# Patient Record
Sex: Male | Born: 1955 | Race: Asian | Hispanic: No | Marital: Married | Smoking: Former smoker
Health system: Southern US, Community
[De-identification: ages and names within clinical notes are randomized; demographics above are authoritative.]

## PROBLEM LIST (undated history)

## (undated) DIAGNOSIS — M1990 Unspecified osteoarthritis, unspecified site: Secondary | ICD-10-CM

## (undated) DIAGNOSIS — E0590 Thyrotoxicosis, unspecified without thyrotoxic crisis or storm: Secondary | ICD-10-CM

## (undated) DIAGNOSIS — I5030 Unspecified diastolic (congestive) heart failure: Secondary | ICD-10-CM

## (undated) DIAGNOSIS — E119 Type 2 diabetes mellitus without complications: Secondary | ICD-10-CM

## (undated) DIAGNOSIS — I4891 Unspecified atrial fibrillation: Secondary | ICD-10-CM

## (undated) DIAGNOSIS — I509 Heart failure, unspecified: Secondary | ICD-10-CM

## (undated) DIAGNOSIS — K219 Gastro-esophageal reflux disease without esophagitis: Secondary | ICD-10-CM

## (undated) HISTORY — DX: Unspecified diastolic (congestive) heart failure: I50.30

## (undated) HISTORY — DX: Heart failure, unspecified: I50.9

---

## 2021-12-14 ENCOUNTER — Emergency Department (HOSPITAL_BASED_OUTPATIENT_CLINIC_OR_DEPARTMENT_OTHER): Payer: Commercial Managed Care - PPO

## 2021-12-14 ENCOUNTER — Encounter (HOSPITAL_BASED_OUTPATIENT_CLINIC_OR_DEPARTMENT_OTHER): Payer: Self-pay | Admitting: Emergency Medicine

## 2021-12-14 ENCOUNTER — Other Ambulatory Visit: Payer: Self-pay

## 2021-12-14 ENCOUNTER — Observation Stay (HOSPITAL_COMMUNITY): Payer: Commercial Managed Care - PPO

## 2021-12-14 ENCOUNTER — Inpatient Hospital Stay (HOSPITAL_BASED_OUTPATIENT_CLINIC_OR_DEPARTMENT_OTHER)
Admission: EM | Admit: 2021-12-14 | Discharge: 2021-12-18 | DRG: 308 | Disposition: A | Discharge status: Home / Self Care | Payer: Commercial Managed Care - PPO | Attending: Internal Medicine | Admitting: Internal Medicine

## 2021-12-14 DIAGNOSIS — I499 Cardiac arrhythmia, unspecified: Secondary | ICD-10-CM | POA: Diagnosis present

## 2021-12-14 DIAGNOSIS — I272 Pulmonary hypertension, unspecified: Secondary | ICD-10-CM | POA: Diagnosis present

## 2021-12-14 DIAGNOSIS — E049 Nontoxic goiter, unspecified: Secondary | ICD-10-CM | POA: Diagnosis not present

## 2021-12-14 DIAGNOSIS — I509 Heart failure, unspecified: Secondary | ICD-10-CM

## 2021-12-14 DIAGNOSIS — D696 Thrombocytopenia, unspecified: Secondary | ICD-10-CM | POA: Diagnosis not present

## 2021-12-14 DIAGNOSIS — I5033 Acute on chronic diastolic (congestive) heart failure: Secondary | ICD-10-CM | POA: Diagnosis not present

## 2021-12-14 DIAGNOSIS — I428 Other cardiomyopathies: Secondary | ICD-10-CM | POA: Diagnosis not present

## 2021-12-14 DIAGNOSIS — Z87891 Personal history of nicotine dependence: Secondary | ICD-10-CM

## 2021-12-14 DIAGNOSIS — G8929 Other chronic pain: Secondary | ICD-10-CM | POA: Diagnosis present

## 2021-12-14 DIAGNOSIS — R000 Tachycardia, unspecified: Secondary | ICD-10-CM | POA: Diagnosis not present

## 2021-12-14 DIAGNOSIS — M549 Dorsalgia, unspecified: Secondary | ICD-10-CM | POA: Diagnosis not present

## 2021-12-14 DIAGNOSIS — I671 Cerebral aneurysm, nonruptured: Secondary | ICD-10-CM | POA: Diagnosis not present

## 2021-12-14 DIAGNOSIS — K219 Gastro-esophageal reflux disease without esophagitis: Secondary | ICD-10-CM | POA: Diagnosis not present

## 2021-12-14 DIAGNOSIS — E119 Type 2 diabetes mellitus without complications: Secondary | ICD-10-CM | POA: Diagnosis not present

## 2021-12-14 DIAGNOSIS — M899 Disorder of bone, unspecified: Secondary | ICD-10-CM | POA: Diagnosis present

## 2021-12-14 DIAGNOSIS — R011 Cardiac murmur, unspecified: Secondary | ICD-10-CM | POA: Diagnosis present

## 2021-12-14 DIAGNOSIS — I480 Paroxysmal atrial fibrillation: Principal | ICD-10-CM | POA: Diagnosis present

## 2021-12-14 DIAGNOSIS — I5030 Unspecified diastolic (congestive) heart failure: Secondary | ICD-10-CM

## 2021-12-14 DIAGNOSIS — G459 Transient cerebral ischemic attack, unspecified: Secondary | ICD-10-CM | POA: Diagnosis not present

## 2021-12-14 DIAGNOSIS — M1990 Unspecified osteoarthritis, unspecified site: Secondary | ICD-10-CM | POA: Diagnosis not present

## 2021-12-14 DIAGNOSIS — E1165 Type 2 diabetes mellitus with hyperglycemia: Secondary | ICD-10-CM

## 2021-12-14 DIAGNOSIS — I5031 Acute diastolic (congestive) heart failure: Secondary | ICD-10-CM

## 2021-12-14 DIAGNOSIS — I4891 Unspecified atrial fibrillation: Secondary | ICD-10-CM | POA: Diagnosis not present

## 2021-12-14 DIAGNOSIS — R471 Dysarthria and anarthria: Secondary | ICD-10-CM | POA: Diagnosis not present

## 2021-12-14 DIAGNOSIS — E0590 Thyrotoxicosis, unspecified without thyrotoxic crisis or storm: Secondary | ICD-10-CM | POA: Diagnosis present

## 2021-12-14 DIAGNOSIS — R42 Dizziness and giddiness: Secondary | ICD-10-CM | POA: Diagnosis present

## 2021-12-14 DIAGNOSIS — D649 Anemia, unspecified: Secondary | ICD-10-CM | POA: Diagnosis present

## 2021-12-14 DIAGNOSIS — R9431 Abnormal electrocardiogram [ECG] [EKG]: Secondary | ICD-10-CM

## 2021-12-14 HISTORY — DX: Thyrotoxicosis, unspecified without thyrotoxic crisis or storm: E05.90

## 2021-12-14 HISTORY — DX: Gastro-esophageal reflux disease without esophagitis: K21.9

## 2021-12-14 HISTORY — DX: Unspecified osteoarthritis, unspecified site: M19.90

## 2021-12-14 HISTORY — DX: Unspecified atrial fibrillation: I48.91

## 2021-12-14 HISTORY — DX: Type 2 diabetes mellitus without complications: E11.9

## 2021-12-14 LAB — HEPATIC FUNCTION PANEL
ALT: 23 U/L (ref 0–44)
AST: 32 U/L (ref 15–41)
Albumin: 3.5 g/dL (ref 3.5–5.0)
Alkaline Phosphatase: 223 U/L — ABNORMAL HIGH (ref 38–126)
Bilirubin, Direct: 0.7 mg/dL — ABNORMAL HIGH (ref 0.0–0.2)
Indirect Bilirubin: 0.9 mg/dL (ref 0.3–0.9)
Total Bilirubin: 1.6 mg/dL — ABNORMAL HIGH (ref 0.3–1.2)
Total Protein: 7 g/dL (ref 6.5–8.1)

## 2021-12-14 LAB — BASIC METABOLIC PANEL
Anion gap: 6 (ref 5–15)
BUN: 12 mg/dL (ref 8–23)
CO2: 23 mmol/L (ref 22–32)
Calcium: 8.6 mg/dL — ABNORMAL LOW (ref 8.9–10.3)
Chloride: 109 mmol/L (ref 98–111)
Creatinine, Ser: 0.43 mg/dL — ABNORMAL LOW (ref 0.61–1.24)
GFR, Estimated: >60 mL/min (ref >60)
Glucose, Bld: 162 mg/dL — ABNORMAL HIGH (ref 70–99)
Potassium: 3.9 mmol/L (ref 3.5–5.1)
Sodium: 138 mmol/L (ref 135–145)

## 2021-12-14 LAB — URINALYSIS, ROUTINE W REFLEX MICROSCOPIC
Bilirubin Urine: NEGATIVE
Glucose, UA: 250 mg/dL — AB
Hgb urine dipstick: NEGATIVE
Ketones, ur: NEGATIVE mg/dL
Leukocytes,Ua: NEGATIVE
Nitrite: NEGATIVE
Protein, ur: NEGATIVE mg/dL
Specific Gravity, Urine: 1.025 (ref 1.005–1.030)
pH: 7 (ref 5.0–8.0)

## 2021-12-14 LAB — CBC
HCT: 37.6 % — ABNORMAL LOW (ref 39.0–52.0)
Hemoglobin: 12.7 g/dL — ABNORMAL LOW (ref 13.0–17.0)
MCH: 29.6 pg (ref 26.0–34.0)
MCHC: 33.8 g/dL (ref 30.0–36.0)
MCV: 87.6 fL (ref 80.0–100.0)
Platelets: 112 10*3/uL — ABNORMAL LOW (ref 150–400)
RBC: 4.29 MIL/uL (ref 4.22–5.81)
RDW: 14.8 % (ref 11.5–15.5)
WBC: 5.5 10*3/uL (ref 4.0–10.5)
nRBC: 0 % (ref 0.0–0.2)

## 2021-12-14 LAB — TROPONIN I (HIGH SENSITIVITY)
Troponin I (High Sensitivity): 9 ng/L (ref <18)
Troponin I (High Sensitivity): 9 ng/L (ref <18)

## 2021-12-14 LAB — BRAIN NATRIURETIC PEPTIDE: B Natriuretic Peptide: 476.6 pg/mL — ABNORMAL HIGH (ref 0.0–100.0)

## 2021-12-14 LAB — MAGNESIUM: Magnesium: 1.8 mg/dL (ref 1.7–2.4)

## 2021-12-14 LAB — GLUCOSE, CAPILLARY: Glucose-Capillary: 166 mg/dL — ABNORMAL HIGH (ref 70–99)

## 2021-12-14 LAB — TSH: TSH: <0.01 u[IU]/mL — ABNORMAL LOW (ref 0.350–4.500)

## 2021-12-14 MED ORDER — ONDANSETRON HCL 4 MG/2ML IJ SOLN: 4.0000 mg | Freq: Four times a day (QID) | INTRAMUSCULAR | Status: DC | PRN

## 2021-12-14 MED ORDER — ATENOLOL 25 MG PO TABS
12.5000 mg | ORAL_TABLET | Freq: Two times a day (BID) | ORAL | Status: DC
  Administered 2021-12-14 – 2021-12-15 (×2): 12.5 mg via ORAL
  Filled 2021-12-14 (×2): qty 1

## 2021-12-14 MED ORDER — METOPROLOL TARTRATE 25 MG PO TABS: 25.0000 mg | ORAL_TABLET | Freq: Two times a day (BID) | ORAL | Status: DC

## 2021-12-14 MED ORDER — APIXABAN 5 MG PO TABS
5.0000 mg | ORAL_TABLET | Freq: Two times a day (BID) | ORAL | Status: DC
  Administered 2021-12-14 – 2021-12-18 (×8): 5 mg via ORAL
  Filled 2021-12-14 (×8): qty 1

## 2021-12-14 MED ORDER — DILTIAZEM HCL-DEXTROSE 125-5 MG/125ML-% IV SOLN (PREMIX)
5.0000 mg/h | INTRAVENOUS | Status: DC
  Administered 2021-12-14: 5 mg/h via INTRAVENOUS
  Filled 2021-12-14: qty 125

## 2021-12-14 MED ORDER — ACETAMINOPHEN 325 MG PO TABS: 650.0000 mg | ORAL_TABLET | ORAL | Status: DC | PRN

## 2021-12-14 MED ORDER — FUROSEMIDE 10 MG/ML IJ SOLN
20.0000 mg | Freq: Two times a day (BID) | INTRAMUSCULAR | Status: DC
  Administered 2021-12-14: 20 mg via INTRAVENOUS
  Filled 2021-12-14: qty 2

## 2021-12-14 MED ORDER — DILTIAZEM LOAD VIA INFUSION
20.0000 mg | Freq: Once | INTRAVENOUS | Status: AC
  Administered 2021-12-14: 20 mg via INTRAVENOUS
  Filled 2021-12-14: qty 20

## 2021-12-14 MED ORDER — IOHEXOL 350 MG/ML SOLN
75.0000 mL | Freq: Once | INTRAVENOUS | Status: AC | PRN
  Administered 2021-12-14: 75 mL via INTRAVENOUS

## 2021-12-14 MED ORDER — HEPARIN BOLUS VIA INFUSION
2500.0000 [IU] | Freq: Once | INTRAVENOUS | Status: AC
  Administered 2021-12-14: 2500 [IU] via INTRAVENOUS

## 2021-12-14 MED ORDER — INSULIN ASPART 100 UNIT/ML IJ SOLN
0.0000 [IU] | Freq: Three times a day (TID) | INTRAMUSCULAR | Status: DC
  Administered 2021-12-15 – 2021-12-16 (×2): 1 [IU] via SUBCUTANEOUS
  Administered 2021-12-17: 3 [IU] via SUBCUTANEOUS
  Administered 2021-12-18: 2 [IU] via SUBCUTANEOUS

## 2021-12-14 MED ORDER — HEPARIN (PORCINE) 25000 UT/250ML-% IV SOLN
800.0000 [IU]/h | INTRAVENOUS | Status: DC
Start: 2021-12-14 — End: 2021-12-14
  Administered 2021-12-14: 800 [IU]/h via INTRAVENOUS
  Filled 2021-12-14: qty 250

## 2021-12-14 MED ORDER — METOPROLOL TARTRATE 5 MG/5ML IV SOLN: 2.5000 mg | INTRAVENOUS | Status: DC | PRN

## 2021-12-14 MED ORDER — MAGNESIUM SULFATE IN D5W 1-5 GM/100ML-% IV SOLN
1.0000 g | Freq: Once | INTRAVENOUS | Status: AC
  Administered 2021-12-14: 1 g via INTRAVENOUS
  Filled 2021-12-14: qty 100

## 2021-12-14 MED ORDER — FUROSEMIDE 10 MG/ML IJ SOLN: 20.0000 mg | Freq: Two times a day (BID) | INTRAMUSCULAR | Status: DC

## 2021-12-14 MED ORDER — POTASSIUM CHLORIDE CRYS ER 20 MEQ PO TBCR
20.0000 meq | EXTENDED_RELEASE_TABLET | Freq: Once | ORAL | Status: AC
  Administered 2021-12-14: 20 meq via ORAL
  Filled 2021-12-14: qty 1

## 2021-12-14 MED ORDER — FUROSEMIDE 10 MG/ML IJ SOLN: 20.0000 mg | Freq: Once | INTRAMUSCULAR | Status: DC

## 2021-12-14 NOTE — Plan of Care (Signed)
Transfer from [facility]  ?Gary Ryan is a 66 year old [place] speaking patient pmh of DM type II uncontrolled presents with complaints of dizziness and weakness over the last 2-weeks.  Noted some complaints of difficulty speaking.  CTA of the head and neck negative for any signs of a stroke, but did note moderate right and small left pleural effusions, and nonspecific diffuse heterogeneous bone density which work-up for multiple myeloma was thought to be worthwhile, and enlarged thyroid..  Found to be in A-fib with RVR.  Started on Cardizem drip with heart rates more controlled.  Likely needs to be diuresed.  Will need echocardiogram, cardiology consult, and determine if further work-up needed for possible TIA symptoms. ?

## 2021-12-14 NOTE — ED Provider Notes (Signed)
?[facility] EMERGENC[facility] DEPARTMENT ?Provider Note ? ? ?CSN: [id number] ?Arrival date & time: 12/14/21  1222 ? ?  ? ?History ? ?Chief Complaint  ?Patient presents with  ? Dizziness  ? Weakness  ? ? ?Jervon Tat Tritz is a 66 y.o. male with a history of uncontrolled diabetes.  Presents emergency department with a chief complaint of fatigue and dizziness.  Patient reports that he has had generalized fatigue over the last 2 weeks.  Patient states that he woke this morning and was very dizzy.  Patient reports that he felt the room was spinning and also felt lightheaded.  Patient states that he cannot walk due to his dizziness and lightheadedness.  Patient also reports that his legs felt very stiff.  Patient reports that over the last 2 weeks he is also been having shortness of breath with exertion. ? ?Patient also reports that he felt like his speech was off this morning.  Patient is unable to specify exactly what he means.  Patient reports that last night when he went to bed he had no symptoms of dizziness or speech issues.  Patient reports that his speech is back to baseline at this time. ? ?Patient also reports that he is having swelling to his face and has been dealing with intermittent swelling to bilateral lower extremities.  Patient reports that his legs are nonswollen at this time and however feels like his face is. ? ?Denies any fever, chills, cough, chest pain, palpitations, abdominal pain, nausea, vomiting, diarrhea, blood in stool, melena, dysuria, hematuria, urinary urgency, numbness, weakness, facial asymmetry, headache, visual disturbance. ? ? ?Dizziness ?Associated symptoms: shortness of breath   ?Associated symptoms: no blood in stool, no chest pain, no diarrhea, no headaches, no nausea, no palpitations, no vomiting and no weakness   ?Weakness ?Associated symptoms: dizziness and shortness of breath   ?Associated symptoms: no abdominal pain, no chest pain, no diarrhea, no dysuria, no fever, no  frequency, no headaches, no nausea, no seizures, no urgency and no vomiting   ? ?  ? ?Home Medications ?Prior to Admission medications   ?Not on File  ?   ? ?Allergies    ?Patient has no known allergies.   ? ?Review of Systems   ?Review of Systems  ?Constitutional:  Positive for fatigue. Negative for chills and fever.  ?HENT:  Positive for facial swelling.   ?Eyes:  Negative for visual disturbance.  ?Respiratory:  Positive for shortness of breath.   ?Cardiovascular:  Positive for leg swelling. Negative for chest pain and palpitations.  ?Gastrointestinal:  Negative for abdominal distention, abdominal pain, anal bleeding, blood in stool, constipation, diarrhea, nausea, rectal pain and vomiting.  ?Genitourinary:  Negative for difficulty urinating, dysuria, frequency, hematuria and urgency.  ?Musculoskeletal:  Negative for back pain and neck pain.  ?Skin:  Negative for color change and rash.  ?Neurological:  Positive for dizziness and speech difficulty. Negative for tremors, seizures, syncope, facial asymmetry, weakness, light-headedness, numbness and headaches.  ?Psychiatric/Behavioral:  Negative for confusion.   ? ?Physical Exam ?Updated Vital Signs ?BP (!) 141/75   Pulse (!) 104   Temp 98.4 ?F (36.9 ?C) (Oral)   Resp (!) 27   Ht 5\' 4"  (1.626 m)   Wt 54.4 kg   SpO2 98%   BMI 20.60 kg/m?  ?Physical Exam ?Vitals and nursing note reviewed.  ?Constitutional:   ?   General: He is not in acute distress. ?   Appearance: He is not ill-appearing, toxic-appearing or diaphoretic.  ?HENT:  ?  Head: Normocephalic and atraumatic.  ?   Comments: No facial swelling ?Eyes:  ?   General:     ?   Right eye: No discharge.     ?   Left eye: No discharge.  ?   Extraocular Movements: Extraocular movements intact.  ?   Conjunctiva/sclera: Conjunctivae normal.  ?   Pupils: Pupils are equal, round, and reactive to light.  ?Cardiovascular:  ?   Rate and Rhythm: Tachycardia present. Rhythm irregularly irregular.  ?   Pulses:     ?      Radial pulses are 2+ on the right side and 2+ on the left side.  ?Pulmonary:  ?   Effort: Pulmonary effort is normal. No tachypnea, bradypnea or respiratory distress.  ?   Breath sounds: Normal breath sounds. No stridor.  ?Abdominal:  ?   General: Bowel sounds are normal. There is no distension. There are no signs of injury.  ?   Palpations: Abdomen is soft. There is no mass or pulsatile mass.  ?   Tenderness: There is no abdominal tenderness. There is no guarding or rebound.  ?   Hernia: There is no hernia in the umbilical area or ventral area.  ?Musculoskeletal:  ?   Cervical back: Normal range of motion and neck supple. No rigidity.  ?   Right lower leg: Normal.  ?   Left lower leg: Normal.  ?Skin: ?   General: Skin is warm and dry.  ?Neurological:  ?   General: No focal deficit present.  ?   Mental Status: He is alert.  ?   GCS: GCS eye subscore is 4. GCS verbal subscore is 5. GCS motor subscore is 6.  ?   Cranial Nerves: No cranial nerve deficit or facial asymmetry.  ?   Sensory: Sensation is intact.  ?   Motor: No weakness, tremor, seizure activity or pronator drift.  ?   Coordination: Finger-Nose-Finger Test normal.  ?   Gait: Gait is intact. Gait normal.  ?   Comments: CN II-XII intact, equal grip strength, +5 strength to bilateral upper and lower extremities   ?Psychiatric:     ?   Behavior: Behavior is cooperative.  ? ? ?ED Results / Procedures / Treatments   ?Labs ?(all labs ordered are listed, but only abnormal results are displayed) ?Labs Reviewed  ?BASIC METABOLIC PANEL - Abnormal; Notable for the following components:  ?    Result Value  ? Glucose, Bld 162 (*)   ? Creatinine, Ser 0.43 (*)   ? Calcium 8.6 (*)   ? All other components within normal limits  ?CBC - Abnormal; Notable for the following components:  ? Hemoglobin 12.7 (*)   ? HCT 37.6 (*)   ? Platelets 112 (*)   ? All other components within normal limits  ?URINALYSIS, ROUTINE W REFLEX MICROSCOPIC - Abnormal; Notable for the following  components:  ? Glucose, UA 250 (*)   ? All other components within normal limits  ?HEPATIC FUNCTION PANEL - Abnormal; Notable for the following components:  ? Alkaline Phosphatase 223 (*)   ? Total Bilirubin 1.6 (*)   ? Bilirubin, Direct 0.7 (*)   ? All other components within normal limits  ?BRAIN NATRIURETIC PEPTIDE - Abnormal; Notable for the following components:  ? B Natriuretic Peptide 476.6 (*)   ? All other components within normal limits  ?MAGNESIUM  ?TSH  ?HEPARIN LEVEL (UNFRACTIONATED)  ?CBG MONITORING, ED  ?TROPONIN I (HIGH SENSITIVITY)  ?TROPONIN I (HIGH  SENSITIVITY)  ? ? ?EKG ?EKG Interpretation ? ?Date/Time:  Tuesday Dec 14 2021 12:48:17 EDT ?Ventricular Rate:  117 ?PR Interval:    ?QRS Duration: 71 ?QT Interval:  369 ?QTC Calculation: 515 ?R Axis:   93 ?Text Interpretation: Atrial fibrillation Right axis deviation Borderline repolarization abnormality Prolonged QT interval no prior ECG for comparison. No STEMI Confirmed by Antony Blackbird [phone]) on 12/14/2021 12:49:57 PM ? ?Radiology ?CT ANGIO HEAD NECK W WO CM ? ?Result Date: 12/14/2021 ?CLINICAL DATA:  Neuro deficit, acute, stroke suspected. Weakness, dizziness, and facial swelling for 1 month. Increased symptoms today. EXAM: CT ANGIOGRAPHY HEAD AND NECK TECHNIQUE: Multidetector CT imaging of the head and neck was performed using the standard protocol during bolus administration of intravenous contrast. Multiplanar CT image reconstructions and MIPs were obtained to evaluate the vascular anatomy. Carotid stenosis measurements (when applicable) are obtained utilizing NASCET criteria, using the distal internal carotid diameter as the denominator. RADIATION DOSE REDUCTION: This exam was performed according to the departmental dose-optimization program which includes automated exposure control, adjustment of the mA and/or kV according to patient size and/or use of iterative reconstruction technique. CONTRAST:  63mL OMNIPAQUE IOHEXOL 350 MG/ML SOLN  COMPARISON:  None Available. FINDINGS: CT HEAD FINDINGS Brain: There is no evidence of an acute infarct, intracranial hemorrhage, mass, midline shift, or extra-axial fluid collection. The ventricles and sulci are within normal limi

## 2021-12-14 NOTE — ED Notes (Signed)
[facility] onsite at this time to transport patient  ?

## 2021-12-14 NOTE — ED Triage Notes (Signed)
Weakness and dizziness and facial swelling x 1 month. Worse this morning difficulty getting up out of bed. Legs stiff difficulty to take steps.  ?

## 2021-12-14 NOTE — ED Notes (Signed)
Pt resting in bed at this time with wife at bedside. Pt denied any needs, declined blanket. Heparin and Cardizem running per MD order. Call bell at bedside.  ?

## 2021-12-14 NOTE — Progress Notes (Signed)
ANTICOAGULATION CONSULT NOTE - Initial Consult ? ?Pharmacy Consult for heparin ?Indication: atrial fibrillation ? ?No Known Allergies ? ?Patient Measurements: ?Height: 5\' 4"  (162.6 cm) ?Weight: 54.4 kg (120 lb) ?IBW/kg (Calculated) : 59.2 ?Heparin Dosing Weight: TBW ? ?Vital Signs: ?Temp: 98.4 ?F (36.9 ?C) (05/09 1243) ?Temp Source: Oral (05/09 1243) ?BP: 141/75 (05/09 1300) ?Pulse Rate: 104 (05/09 1300) ? ?Labs: ?Recent Labs  ?  12/14/21 ?1254  ?HGB 12.7*  ?HCT 37.6*  ?PLT 112*  ?CREATININE 0.43*  ? ? ?Estimated Creatinine Clearance: 71.8 mL/min (A) (by C-G formula based on SCr of 0.43 mg/dL (L)). ? ? ?Medical History: ?History reviewed. No pertinent past medical history. ? ? ?Assessment: ?72 YOM presenting with weakness, dizziness, in afib, he is not on anticoagulation PTA ? ?Goal of Therapy:  ?Heparin level 0.3-0.7 units/ml ?Monitor platelets by anticoagulation protocol: Yes ?  ?Plan:  ?Heparin 2500 units IV x 1, and gtt at 800 units/hr ?F/u 6 hour heparin level ?F/u long term AC plan ? ?78, PharmD ?Clinical Pharmacist ?ED Pharmacist Phone # [phone] ?12/14/2021 1:54 PM ? ? ? ?

## 2021-12-14 NOTE — H&P (Signed)
?History and Physical  ? ? ?Gary Ryan GEZ:[id number] DOB: [birth year] DOA: 12/14/2021 ? ?PCP: Pcp, No  ? ?Patient coming from: Home  ? ?Chief Complaint: Lightheaded, fatigue, speech difficulty ? ?HPI: Gary Ryan is a pleasant 66 y.o. male with medical history significant for type 2 diabetes mellitus, GERD, and chronic back pain, now presenting to the emergency department with lightheadedness, fatigue, and speech difficulty.  Patient reports developing lightheadedness and fatigue 4 weeks ago which has worsened significantly over the past 2 weeks.  He has also had intermittent dysarthria over the past 2 weeks, as well as intermittent bilateral leg swelling and sensation of facial swelling.  He denies chest pain or palpitations, denies cough or shortness of breath, denies sore throat or difficulty swallowing, and denies fever or chills. ? ?[facility] ED Course: Upon arrival to the ED, patient is found to be afebrile and saturating well on room air with heart rate elevated to 120s.  EKG features atrial fibrillation with rate 117 and QTc interval 515 ms.  Head CT was negative for acute intracranial abnormality.  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  Blood work was most notable for platelets of 112,000 and BNP 477.  Patient was started on IV diltiazem and IV heparin infusions in the emergency department and transferred to [facility] for admission. ? ?Review of Systems:  ?All other systems reviewed and apart from HPI, are negative. ? ?Past Medical History:  ?Diagnosis Date  ? Arthritis   ? Diabetes mellitus without complication (HCC)   ? GERD (gastroesophageal reflux disease)   ? ? ?History reviewed. No pertinent surgical history. ? ?Social History:  ? reports that he has quit smoking. His smoking use included cigarettes. He has never used smokeless tobacco. He  reports that he does not drink alcohol and does not use drugs. ? ?No Known Allergies ? ?Family History  ?Problem Relation Age of Onset  ? Thyroid disease Neg Hx   ? Heart disease Neg Hx   ? ? ? ?Prior to Admission medications   ?Not on File  ? ? ?Physical Exam: ?Vitals:  ? 12/14/21 1700 12/14/21 1715 12/14/21 1730 12/14/21 1929  ?BP: 114/74 110/62 122/77 123/71  ?Pulse: 81 89 88 89  ?Resp: (!) 24 (!) 25 (!) 29 18  ?Temp:    98 ?F (36.7 ?C)  ?TempSrc:    Oral  ?SpO2: 98% 98% 95% 95%  ?Weight:      ?Height:      ? ? ?Constitutional: NAD, calm  ?Eyes: PERTLA, lids and conjunctivae normal ?ENMT: Mucous membranes are moist. Posterior pharynx clear of any exudate or lesions.   ?Neck: supple, no masses  ?Respiratory: no wheezing, no crackles. No accessory muscle use.  ?Cardiovascular: S1 & S2 heard, regular rate and rhythm. Trace lower leg edema. Neck veins distended.  ?Abdomen: No distension, no tenderness, soft. Bowel sounds active.  ?Musculoskeletal: no clubbing / cyanosis. No joint deformity upper and lower extremities.   ?Skin: no significant rashes, lesions, ulcers. Warm, dry, well-perfused. ?Neurologic: CN 2-12 grossly intact. Sensation intact. Strength 5/5 in all 4 limbs. Alert and oriented.  ?Psychiatric: Very pleasant. Cooperative.  ? ? ?Labs and Imaging on Admission: I have personally reviewed following labs and imaging studies ? ?CBC: ?Recent Labs  ?Lab 12/14/21 ?1254  ?WBC 5.5  ?HGB 12.7*  ?HCT 37.6*  ?MCV 87.6  ?PLT  112*  ? ?Basic Metabolic Panel: ?Recent Labs  ?Lab 12/14/21 ?1254  ?NA 138  ?K 3.9  ?CL 109  ?CO2 23  ?GLUCOSE 162*  ?BUN 12  ?CREATININE 0.43*  ?CALCIUM 8.6*  ?MG 1.8  ? ?GFR: ?Estimated Creatinine Clearance: 71.8 mL/min (A) (by C-G formula based on SCr of 0.43 mg/dL (L)). ?Liver Function Tests: ?Recent Labs  ?Lab 12/14/21 ?1254  ?AST 32  ?ALT 23  ?ALKPHOS 223*  ?BILITOT 1.6*  ?PROT 7.0  ?ALBUMIN 3.5  ? ?No results for input(s): LIPASE, AMYLASE in the last 168 hours. ?No results for input(s):  AMMONIA in the last 168 hours. ?Coagulation Profile: ?No results for input(s): INR, PROTIME in the last 168 hours. ?Cardiac Enzymes: ?No results for input(s): CKTOTAL, CKMB, CKMBINDEX, TROPONINI in the last 168 hours. ?BNP (last 3 results) ?No results for input(s): PROBNP in the last 8760 hours. ?HbA1C: ?No results for input(s): HGBA1C in the last 72 hours. ?CBG: ?No results for input(s): GLUCAP in the last 168 hours. ?Lipid Profile: ?No results for input(s): CHOL, HDL, LDLCALC, TRIG, CHOLHDL, LDLDIRECT in the last 72 hours. ?Thyroid Function Tests: ?Recent Labs  ?  12/14/21 ?1425  ?TSH <0.010*  ? ?Anemia Panel: ?No results for input(s): VITAMINB12, FOLATE, FERRITIN, TIBC, IRON, RETICCTPCT in the last 72 hours. ?Urine analysis: ?   ?Component Value Date/Time  ? COLORURINE YELLOW 12/14/2021 1254  ? APPEARANCEUR CLEAR 12/14/2021 1254  ? LABSPEC 1.025 12/14/2021 1254  ? PHURINE 7.0 12/14/2021 1254  ? GLUCOSEU 250 (A) 12/14/2021 1254  ? HGBUR NEGATIVE 12/14/2021 1254  ? BILIRUBINUR NEGATIVE 12/14/2021 1254  ? KETONESUR NEGATIVE 12/14/2021 1254  ? PROTEINUR NEGATIVE 12/14/2021 1254  ? NITRITE NEGATIVE 12/14/2021 1254  ? LEUKOCYTESUR NEGATIVE 12/14/2021 1254  ? ?Sepsis Labs: ?@[email or web address] (procalcitonin:4,lacticidven:4) ?)No results found for this or any previous visit (from the past 240 hour(s)).  ? ?Radiological Exams on Admission: ?CT ANGIO HEAD NECK W WO CM ? ?Result Date: 12/14/2021 ?CLINICAL DATA:  Neuro deficit, acute, stroke suspected. Weakness, dizziness, and facial swelling for 1 month. Increased symptoms today. EXAM: CT ANGIOGRAPHY HEAD AND NECK TECHNIQUE: Multidetector CT imaging of the head and neck was performed using the standard protocol during bolus administration of intravenous contrast. Multiplanar CT image reconstructions and MIPs were obtained to evaluate the vascular anatomy. Carotid stenosis measurements (when applicable) are obtained utilizing NASCET criteria, using the distal internal carotid diameter  as the denominator. RADIATION DOSE REDUCTION: This exam was performed according to the departmental dose-optimization program which includes automated exposure control, adjustment of the mA and/or kV according to patient size and/or use of iterative reconstruction technique. CONTRAST:  31mL OMNIPAQUE IOHEXOL 350 MG/ML SOLN COMPARISON:  None Available. FINDINGS: CT HEAD FINDINGS Brain: There is no evidence of an acute infarct, intracranial hemorrhage, mass, midline shift, or extra-axial fluid collection. The ventricles and sulci are within normal limits for age. Vascular: Reported below. Skull: No fracture. Sinuses: Visualized paranasal sinuses and mastoid air cells are clear. Orbits: Unremarkable. Review of the MIP images confirms the above findings CTA NECK FINDINGS Aortic arch: Standard 3 vessel aortic arch. Wide patency of the brachiocephalic and subclavian arteries. Right carotid system: Patent without evidence of stenosis, dissection, or significant atherosclerosis. Left carotid system: Patent without evidence of stenosis, dissection, or significant atherosclerosis. Vertebral arteries: Patent without evidence of stenosis, dissection, or significant atherosclerosis. Codominant. Skeleton: Multiple dental caries and periapical lucencies. Diffusely heterogeneous bone density. Other neck: Moderate diffuse enlargement and mild heterogeneity of the thyroid gland. Upper chest: Partially visualized moderate right  and small left pleural effusions with compressive atelectasis on the right. Review of the MIP images confirms the above findings CTA HEAD FINDINGS Anterior circulation: The internal carotid arteries are widely patent from skull base to carotid termini there is a 2 mm inferiorly directed outpouching from the right paraclinoid ICA. ACAs and MCAs are patent without evidence of a proximal branch occlusion or significant proximal stenosis. Posterior circulation: The intracranial vertebral arteries are patent to the  basilar. Patent bilateral PICA, right AICA, and bilateral SCA origins are seen. The basilar artery is widely patent. 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

## 2021-12-14 NOTE — ED Triage Notes (Signed)
Pt states he has been having ringing in ears on and off since he was a child.  ?

## 2021-12-15 ENCOUNTER — Observation Stay (HOSPITAL_COMMUNITY): Payer: Commercial Managed Care - PPO

## 2021-12-15 ENCOUNTER — Other Ambulatory Visit (HOSPITAL_COMMUNITY): Payer: Self-pay

## 2021-12-15 DIAGNOSIS — I4891 Unspecified atrial fibrillation: Secondary | ICD-10-CM | POA: Diagnosis not present

## 2021-12-15 LAB — CBC
HCT: 38.7 % — ABNORMAL LOW (ref 39.0–52.0)
Hemoglobin: 13.1 g/dL (ref 13.0–17.0)
MCH: 29.7 pg (ref 26.0–34.0)
MCHC: 33.9 g/dL (ref 30.0–36.0)
MCV: 87.8 fL (ref 80.0–100.0)
Platelets: 121 10*3/uL — ABNORMAL LOW (ref 150–400)
RBC: 4.41 MIL/uL (ref 4.22–5.81)
RDW: 14.6 % (ref 11.5–15.5)
WBC: 6 10*3/uL (ref 4.0–10.5)
nRBC: 0 % (ref 0.0–0.2)

## 2021-12-15 LAB — MAGNESIUM: Magnesium: 2 mg/dL (ref 1.7–2.4)

## 2021-12-15 LAB — T4, FREE: Free T4: 3.68 ng/dL — ABNORMAL HIGH (ref 0.61–1.12)

## 2021-12-15 LAB — BASIC METABOLIC PANEL
Anion gap: 3 — ABNORMAL LOW (ref 5–15)
BUN: 11 mg/dL (ref 8–23)
CO2: 22 mmol/L (ref 22–32)
Calcium: 8.9 mg/dL (ref 8.9–10.3)
Chloride: 110 mmol/L (ref 98–111)
Creatinine, Ser: 0.44 mg/dL — ABNORMAL LOW (ref 0.61–1.24)
GFR, Estimated: >60 mL/min (ref >60)
Glucose, Bld: 155 mg/dL — ABNORMAL HIGH (ref 70–99)
Potassium: 4.3 mmol/L (ref 3.5–5.1)
Sodium: 135 mmol/L (ref 135–145)

## 2021-12-15 LAB — LIPID PANEL
Cholesterol: 84 mg/dL (ref 0–200)
HDL: 26 mg/dL — ABNORMAL LOW (ref >40)
LDL Cholesterol: 51 mg/dL (ref 0–99)
Total CHOL/HDL Ratio: 3.2 RATIO
Triglycerides: 35 mg/dL (ref <150)
VLDL: 7 mg/dL (ref 0–40)

## 2021-12-15 LAB — GLUCOSE, CAPILLARY
Glucose-Capillary: 110 mg/dL — ABNORMAL HIGH (ref 70–99)
Glucose-Capillary: 133 mg/dL — ABNORMAL HIGH (ref 70–99)
Glucose-Capillary: 171 mg/dL — ABNORMAL HIGH (ref 70–99)
Glucose-Capillary: 134 mg/dL — ABNORMAL HIGH (ref 70–99)

## 2021-12-15 LAB — ECHOCARDIOGRAM COMPLETE
Height: 64 in
S' Lateral: 3.1 cm
Weight: 1857.6 oz

## 2021-12-15 LAB — HEMOGLOBIN A1C
Hgb A1c MFr Bld: 6.5 % — ABNORMAL HIGH (ref 4.8–5.6)
Mean Plasma Glucose: 139.85 mg/dL

## 2021-12-15 LAB — LDL CHOLESTEROL, DIRECT: Direct LDL: 50.1 mg/dL (ref 0–99)

## 2021-12-15 LAB — HIV ANTIBODY (ROUTINE TESTING W REFLEX): HIV Screen 4th Generation wRfx: NONREACTIVE

## 2021-12-15 MED ORDER — ATENOLOL 25 MG PO TABS
25.0000 mg | ORAL_TABLET | Freq: Two times a day (BID) | ORAL | Status: DC
  Administered 2021-12-15 – 2021-12-18 (×6): 25 mg via ORAL
  Filled 2021-12-15 (×6): qty 1

## 2021-12-15 MED ORDER — FUROSEMIDE 10 MG/ML IJ SOLN
20.0000 mg | Freq: Two times a day (BID) | INTRAMUSCULAR | Status: DC
Start: 2021-12-15 — End: 2021-12-17
  Administered 2021-12-15 – 2021-12-17 (×4): 20 mg via INTRAVENOUS
  Filled 2021-12-15 (×4): qty 2

## 2021-12-15 MED ORDER — DIGOXIN 0.25 MG/ML IJ SOLN
0.5000 mg | Freq: Once | INTRAMUSCULAR | Status: AC
  Administered 2021-12-15: 0.5 mg via INTRAVENOUS
  Filled 2021-12-15: qty 2

## 2021-12-15 MED ORDER — METHIMAZOLE 5 MG PO TABS
5.0000 mg | ORAL_TABLET | Freq: Two times a day (BID) | ORAL | Status: DC
  Administered 2021-12-15 – 2021-12-18 (×7): 5 mg via ORAL
  Filled 2021-12-15 (×8): qty 1

## 2021-12-15 MED ORDER — DIGOXIN 0.25 MG/ML IJ SOLN
0.2500 mg | Freq: Once | INTRAMUSCULAR | Status: AC
  Administered 2021-12-15: 0.25 mg via INTRAVENOUS
  Filled 2021-12-15: qty 1

## 2021-12-15 MED ORDER — DAPAGLIFLOZIN PROPANEDIOL 10 MG PO TABS
10.0000 mg | ORAL_TABLET | Freq: Every day | ORAL | Status: DC
  Administered 2021-12-15 – 2021-12-18 (×4): 10 mg via ORAL
  Filled 2021-12-15 (×4): qty 1

## 2021-12-15 NOTE — Progress Notes (Signed)
Shift Assessment completed with [employer] translator number [phone].     ?

## 2021-12-15 NOTE — Progress Notes (Signed)
?PROGRESS NOTE ? ? ? ?Gary Ryan  [id number] DOB: 24-Nov-1955 DOA: 12/14/2021 ?PCP: Pcp, No  ?65/M with history of type 2 diabetes mellitus, chronic back pain, GERD presented to the ED with fatigue, lightheadedness and intermittent's speech difficulty for a few weeks, wife also noticed leg swelling and facial swelling. ?-In the ED he was noted to be in A-fib RVR, CTA head and neck noted enlarged thyroid, heterogenous bone density, labs notable for platelet of 112K and BNP of 477 ? ? ?Subjective: ?-Feels a little better, breathing is improving ? ?Assessment and Plan: ? ?Atrial fibrillation with RVR  ?- Presents with 4 wks of lightheadedness and intermittent swelling and found to be in new-onset atrial fibrillation with RVR ?- CHADS-VASc may be only 1 (DM)  ?- TSH is <0.01 and free T4 elevated at 3.6, hyperthyroidism could be triggering this ?- Started on atenolol and methimazole ?- Will request cardiology input, may need cardioversion ? ?New onset CHF ?-Bilateral pleural effusions, edema, dyspnea on exertion ?-Possibly triggered by A-fib RVR ?-Continue low-dose IV Lasix today, transition to oral diuretics likely tomorrow ?-Follow-up echocardiogram ?  ?Hyperthyroidism; enlarged thyroid   ?- TSH is <0.01 in ED, thyroid enlarged and heterogeneous on CT  ?-Free T4 is elevated, started on atenolol last night ?-Add methimazole, recommended endocrinology follow-up and thyroid ultrasound,  ?  ?Thrombocytopenia  ?- Platelets 112k on admission mild, monitor ?  ?Abnormal bone density  ?- Diffusely heterogenous bone density noted incidentally on CT in ED  ?- Calcium slightly low, creatinine normal  ?-Check myeloma panel ?-Will need further work-up ?  ?Type II DM  ?- A1c was 6.6% in October 2022  ?-Diet controlled, continue sliding scale insulin ?  ?Prolonged QT interval  ?- QTc is 515 ms in ED  ?- Keep potassium 4 and mag 2, avoid QT-prolonging medications   ?  ??ICA aneurysm  ?- 2 mm ICA aneurysm vs infundibulum noted on  CTA in ED  ?- Outpatient follow-up recommended  ?  ?Dysarthria  ?- Patient reports 2 wks of intermittent dysarthria, most recently yesterday evening and ?- No acute intracranial abnormality noted on head CT, and no focal deficit identified on exam  ?-MRI brain unremarkable ?-?  TIA in the setting of A-fib, now resolved, monitor ?  ?  ?DVT prophylaxis: Eliquis  ?Code Status: Full  ?Family Communication: Wife at bedside ?Disposition Plan: Home likely 48 hours ? ?Consultants:  ? ? ?Procedures:  ? ?Antimicrobials:  ? ? ?Objective: ?Vitals:  ? 12/14/21 2350 12/15/21 0350 12/15/21 0737 12/15/21 0814  ?BP: 113/67 103/75 92/61 102/65  ?Pulse: 73 77 86 90  ?Resp: 18 16 17 18   ?Temp: 98.8 ?F (37.1 ?C) 98 ?F (36.7 ?C) 97.8 ?F (36.6 ?C) 97.6 ?F (36.4 ?C)  ?TempSrc: Oral Oral Oral Oral  ?SpO2: 94% 94% 93% 97%  ?Weight: 54.4 kg 52.7 kg    ?Height: 5\' 4"  (1.626 m)     ? ? ?Intake/Output Summary (Last 24 hours) at 12/15/2021 1346 ?Last data filed at 12/15/2021 0800 ?Gross per 24 hour  ?Intake 291.37 ml  ?Output --  ?Net 291.37 ml  ? ?Filed Weights  ? 12/14/21 1238 12/14/21 2350 12/15/21 0350  ?Weight: 54.4 kg 54.4 kg 52.7 kg  ? ? ?Examination: ? ?General exam: Pleasant thinly built male laying in bed, AAOx3 ?CVS: S1-S2, irregularly irregular rhythm ?Lungs: Decreased breath sounds bases ?Abdomen: Soft, nontender, bowel sounds present ?Extremities: Trace edema ?Skin: No rashes ?Psychiatry:  Mood & affect appropriate.  ? ? ? ?  Data Reviewed:  ? ?CBC: ?Recent Labs  ?Lab 12/14/21 ?1254 12/15/21 ?0411  ?WBC 5.5 6.0  ?HGB 12.7* 13.1  ?HCT 37.6* 38.7*  ?MCV 87.6 87.8  ?PLT 112* 121*  ? ?Basic Metabolic Panel: ?Recent Labs  ?Lab 12/14/21 ?1254 12/15/21 ?0411  ?NA 138 135  ?K 3.9 4.3  ?CL 109 110  ?CO2 23 22  ?GLUCOSE 162* 155*  ?BUN 12 11  ?CREATININE 0.43* 0.44*  ?CALCIUM 8.6* 8.9  ?MG 1.8 2.0  ? ?GFR: ?Estimated Creatinine Clearance: 69.5 mL/min (A) (by C-G formula based on SCr of 0.44 mg/dL (L)). ?Liver Function Tests: ?Recent Labs  ?Lab  12/14/21 ?1254  ?AST 32  ?ALT 23  ?ALKPHOS 223*  ?BILITOT 1.6*  ?PROT 7.0  ?ALBUMIN 3.5  ? ?No results for input(s): LIPASE, AMYLASE in the last 168 hours. ?No results for input(s): AMMONIA in the last 168 hours. ?Coagulation Profile: ?No results for input(s): INR, PROTIME in the last 168 hours. ?Cardiac Enzymes: ?No results for input(s): CKTOTAL, CKMB, CKMBINDEX, TROPONINI in the last 168 hours. ?BNP (last 3 results) ?No results for input(s): PROBNP in the last 8760 hours. ?HbA1C: ?Recent Labs  ?  12/15/21 ?0411  ?HGBA1C 6.5*  ? ?CBG: ?Recent Labs  ?Lab 12/14/21 ?2120 12/15/21 ?0734 12/15/21 ?1146  ?GLUCAP 166* 110* 133*  ? ?Lipid Profile: ?No results for input(s): CHOL, HDL, LDLCALC, TRIG, CHOLHDL, LDLDIRECT in the last 72 hours. ?Thyroid Function Tests: ?Recent Labs  ?  12/14/21 ?1425 12/15/21 ?0411  ?TSH <0.010*  --   ?FREET4  --  3.68*  ? ?Anemia Panel: ?No results for input(s): VITAMINB12, FOLATE, FERRITIN, TIBC, IRON, RETICCTPCT in the last 72 hours. ?Urine analysis: ?   ?Component Value Date/Time  ? [place] 12/14/2021 1254  ? APPEARANCEUR CLEAR 12/14/2021 1254  ? LABSPEC 1.025 12/14/2021 1254  ? PHURINE 7.0 12/14/2021 1254  ? GLUCOSEU 250 (A) 12/14/2021 1254  ? HGBUR NEGATIVE 12/14/2021 1254  ? BILIRUBINUR NEGATIVE 12/14/2021 1254  ? [place] NEGATIVE 12/14/2021 1254  ? PROTEINUR NEGATIVE 12/14/2021 1254  ? NITRITE NEGATIVE 12/14/2021 1254  ? LEUKOCYTESUR NEGATIVE 12/14/2021 1254  ? ?Sepsis Labs: ?@[email or web address] (procalcitonin:4,lacticidven:4) ? ?)No results found for this or any previous visit (from the past 240 hour(s)).  ? ?Radiology Studies: ?CT ANGIO HEAD NECK W WO CM ? ?Result Date: 12/14/2021 ?CLINICAL DATA:  Neuro deficit, acute, stroke suspected. Weakness, dizziness, and facial swelling for 1 month. Increased symptoms today. EXAM: CT ANGIOGRAPHY HEAD AND NECK TECHNIQUE: Multidetector CT imaging of the head and neck was performed using the standard protocol during bolus administration of  intravenous contrast. Multiplanar CT image reconstructions and MIPs were obtained to evaluate the vascular anatomy. Carotid stenosis measurements (when applicable) are obtained utilizing NASCET criteria, using the distal internal carotid diameter as the denominator. RADIATION DOSE REDUCTION: This exam was performed according to the departmental dose-optimization program which includes automated exposure control, adjustment of the mA and/or kV according to patient size and/or use of iterative reconstruction technique. CONTRAST:  57mL OMNIPAQUE IOHEXOL 350 MG/ML SOLN COMPARISON:  None Available. FINDINGS: CT HEAD FINDINGS Brain: There is no evidence of an acute infarct, intracranial hemorrhage, mass, midline shift, or extra-axial fluid collection. The ventricles and sulci are within normal limits for age. Vascular: Reported below. Skull: No fracture. Sinuses: Visualized paranasal sinuses and mastoid air cells are clear. Orbits: Unremarkable. Review of the MIP images confirms the above findings CTA NECK FINDINGS Aortic arch: Standard 3 vessel aortic arch. Wide patency of the brachiocephalic and subclavian arteries. Right carotid system:  Patent without evidence of stenosis, dissection, or significant atherosclerosis. Left carotid system: Patent without evidence of stenosis, dissection, or significant atherosclerosis. Vertebral arteries: Patent without evidence of stenosis, dissection, or significant atherosclerosis. Codominant. Skeleton: Multiple dental caries and periapical lucencies. Diffusely heterogeneous bone density. Other neck: Moderate diffuse enlargement and mild heterogeneity of the thyroid gland. Upper chest: Partially visualized moderate right and small left pleural effusions with compressive atelectasis on the right. Review of the MIP images confirms the above findings CTA HEAD FINDINGS Anterior circulation: The internal carotid arteries are widely patent from skull base to carotid termini there is a 2 mm  inferiorly directed outpouching from the right paraclinoid ICA. ACAs and MCAs are patent without evidence of a proximal branch occlusion or significant proximal stenosis. Posterior circulation: The intracranial

## 2021-12-15 NOTE — Plan of Care (Signed)
  Problem: Cardiac: Goal: Ability to achieve and maintain adequate cardiopulmonary perfusion will improve Outcome: Progressing   

## 2021-12-15 NOTE — Progress Notes (Signed)
Pt heart rate up to 140's went in pt's room pt was up at sink washing hands. Pt stated used bathroom. Using translator reeducated pt and pt's wife to use urinal so that staff can keep up with output and collect urine for 24 hour urine collection they both verbalized understanding.  ?

## 2021-12-15 NOTE — Consult Note (Signed)
CARDIOLOGY CONSULT NOTE  ?Patient ID: ?Gary Ryan ?MRN: [id number] ?DOB/AGE: 1956/05/09 66 y.o. ? ?Admit date: 12/14/2021 ?Attending physician: Domenic Polite, MD ?Primary Physician:  Pcp, No ?Outpatient Cardiologist: NA ?Inpatient Cardiologist: Rex Kras, DO, [facility] ? ?Reason of consultation: Atrial Fibrillation  ?Referring physician: Domenic Polite, MD ? ?Chief complaint: Dizziness and weakness ? ?HPI:  ?Gary Ryan is a 66 y.o. [place] male who presents with a chief complaint of " dizziness weakness." His past medical history and cardiovascular risk factors include: diabetes, former smoker. ? ?Patient has been experiencing generalized fatigue over the last 2 weeks which is getting progressively worse.  On the day of admission he woke up feeling dizzy, difficulty ambulating and felt that his legs were stiff.  He denies chest pain at rest or with effort related activities but has been experiencing shortness of breath with effort related activities over the last 1 month, progressively worsening experiencing paroxysmal nocturnal dyspnea and lower extremity swelling and weight gain.  He presented to the ED for further evaluation and management. ? ?Cardiology was consulted today for management of atrial fibrillation in the setting of hyperthyroidism and congestive heart failure.  In the emergency room initial EKG noted atrial fibrillation with rapid ventricular rate and he was started on IV heparin as well as Cardizem drip.  Initial labs noted thrombocytopenia, BNP of 477, TSH <0.01.  Given his dysarthria he underwent CTA head and neck results reviewed noted below.  MRI of the brain without contrast noted no acute intracranial abnormality per report.   ? ?Since hospitalization patient states that his shortness of breath is at least 50% better is no longer experiencing lower extremity swelling or PND.  He continues to feel tired fatigued and worn out.  No prior history of intracranial bleeding or  gastrointestinal bleeding.  No history of falls. ? ?Today's encounter was prolonged due to language barrier and use of [employer] translator to conduct this consultation.  Wife is present at bedside who also speaks very minimal [place].  With the help of the translator it appears that they have 1 son who is able to speak [place] fluently and lives in [place]. ? ?ALLERGIES: ?No Known Allergies ? ?PAST MEDICAL HISTORY: ?Past Medical History:  ?Diagnosis Date  ? Arthritis   ? Diabetes mellitus without complication ([place])   ? GERD (gastroesophageal reflux disease)   ? ? ?PAST SURGICAL HISTORY: ?Nose surgery. ?Left finger surgery ? ?FAMILY HISTORY: ?No family history of premature coronary artery disease. ?Father has passed away.  Mother still alive in her 62s. ?  ?SOCIAL HISTORY:  ?Former smoker. ?Does not consume alcohol or illicit drugs. ? ?MEDICATIONS: ?No home medications. ? ?REVIEW OF SYSTEMS: ?Review of Systems  ?Constitutional: Positive for malaise/fatigue and weight gain. Negative for decreased appetite.  ?Cardiovascular:  Positive for dyspnea on exertion (Improved), leg swelling (Improved), palpitations and paroxysmal nocturnal dyspnea (Now resolved). Negative for chest pain, near-syncope, orthopnea and syncope.  ?Respiratory:  Positive for shortness of breath.   ?Hematologic/Lymphatic: Negative for bleeding problem.  ?Musculoskeletal:  Negative for joint pain and muscle cramps.  ?Gastrointestinal:  Negative for nausea and vomiting.  ?Neurological:  Positive for dizziness, light-headedness and weakness.  ?Psychiatric/Behavioral:  Negative for substance abuse and suicidal ideas. The patient does not have insomnia.   ?All other systems reviewed and are negative. ? ?PHYSICAL EXAM: ? ?  12/15/2021  ?  8:14 AM 12/15/2021  ?  7:37 AM 12/15/2021  ?  3:50 AM  ?Vitals with BMI  ?Weight  116 lbs 2 oz  ?BMI   19.92  ?Systolic [id number]  ?Diastolic 65 61 75  ?Pulse 90 86 77  ? ? ? ?Intake/Output Summary (Last 24 hours) at  12/15/2021 1420 ?Last data filed at 12/15/2021 0800 ?Gross per 24 hour  ?Intake 291.37 ml  ?Output --  ?Net 291.37 ml  ?  ?Net IO Since Admission: 291.37 mL [12/15/21 1420] ? ?CONSTITUTIONAL: Age-appropriate, predominately speaks [place], resting in bed comfortably, no acute distress, hemodynamically stable  ?SKIN: Skin is warm and dry. No rash noted. No cyanosis. No pallor. No jaundice ?HEAD: Normocephalic and atraumatic.  ?EYES: No scleral icterus ?MOUTH/THROAT: Moist oral membranes.  ?NECK: JVD present.  Mild thyromegaly noted. No carotid bruits  ?CHEST Normal respiratory effort. No intercostal retractions  ?LUNGS: Clear to auscultation bilaterally.  No stridor. No wheezes. No rales.  ?CARDIOVASCULAR: Irregularly irregular, tachycardic, variable [id number], holosystolic murmur heard at the apex, no gallops or rubs.   ?ABDOMINAL: Soft, nontender, nondistended, positive bowel sounds in all 4 quadrants, no apparent ascites.  ?EXTREMITIES: No pitting edema, warm to touch, 2+ DP and PT pulses.  ?HEMATOLOGIC: No significant bruising ?NEUROLOGIC: Oriented to person, place, and time. Nonfocal. Normal muscle tone.  ?PSYCHIATRIC: Normal mood and affect. Normal behavior. Cooperative ? ?RADIOLOGY: ?CT ANGIO HEAD NECK W WO CM ? ?Result Date: 12/14/2021 ?CLINICAL DATA:  Neuro deficit, acute, stroke suspected. Weakness, dizziness, and facial swelling for 1 month. Increased symptoms today. EXAM: CT ANGIOGRAPHY HEAD AND NECK TECHNIQUE: Multidetector CT imaging of the head and neck was performed using the standard protocol during bolus administration of intravenous contrast. Multiplanar CT image reconstructions and MIPs were obtained to evaluate the vascular anatomy. Carotid stenosis measurements (when applicable) are obtained utilizing NASCET criteria, using the distal internal carotid diameter as the denominator. RADIATION DOSE REDUCTION: This exam was performed according to the departmental dose-optimization program which includes  automated exposure control, adjustment of the mA and/or kV according to patient size and/or use of iterative reconstruction technique. CONTRAST:  51mL OMNIPAQUE IOHEXOL 350 MG/ML SOLN COMPARISON:  None Available. FINDINGS: CT HEAD FINDINGS Brain: There is no evidence of an acute infarct, intracranial hemorrhage, mass, midline shift, or extra-axial fluid collection. The ventricles and sulci are within normal limits for age. Vascular: Reported below. Skull: No fracture. Sinuses: Visualized paranasal sinuses and mastoid air cells are clear. Orbits: Unremarkable. Review of the MIP images confirms the above findings CTA NECK FINDINGS Aortic arch: Standard 3 vessel aortic arch. Wide patency of the brachiocephalic and subclavian arteries. Right carotid system: Patent without evidence of stenosis, dissection, or significant atherosclerosis. Left carotid system: Patent without evidence of stenosis, dissection, or significant atherosclerosis. Vertebral arteries: Patent without evidence of stenosis, dissection, or significant atherosclerosis. Codominant. Skeleton: Multiple dental caries and periapical lucencies. Diffusely heterogeneous bone density. Other neck: Moderate diffuse enlargement and mild heterogeneity of the thyroid gland. Upper chest: Partially visualized moderate right and small left pleural effusions with compressive atelectasis on the right. Review of the MIP images confirms the above findings CTA HEAD FINDINGS Anterior circulation: The internal carotid arteries are widely patent from skull base to carotid termini there is a 2 mm inferiorly directed outpouching from the right paraclinoid ICA. ACAs and MCAs are patent without evidence of a proximal branch occlusion or significant proximal stenosis. Posterior circulation: The intracranial vertebral arteries are patent to the basilar. Patent bilateral PICA, right AICA, and bilateral SCA origins are seen. The basilar artery is widely patent. There are large  posterior communicating arteries bilaterally with  hypoplastic right and absent left P1 segments. Both PCAs are patent without evidence of a significant proximal stenosis. No aneurysm is identified. Venous sinuses: Patent. A

## 2021-12-15 NOTE — TOC Benefit Eligibility Note (Signed)
Patient Advocate Encounter ? ?Insurance verification completed.   ? ?The patient is currently admitted and upon discharge could be taking Eliquis 5 mg. ? ?The current 30 day co-pay is, $544.51 due to a $6,000.00 deductible.  ? ?The patient is currently admitted and upon discharge could be taking Xarelto 20 mg. ? ?The current 30 day co-pay is, $526.77 due to a $6,000.00 deductible.  ? ?The patient is insured through [employer]  ? ? ? ?Roland Earl, CPhT ?Pharmacy Patient Advocate Specialist ?[facility] Patient Advocate Team ?Direct Number: [phone]  Fax: [phone] ? ? ? ? ? ?  ?

## 2021-12-15 NOTE — Progress Notes (Signed)
24 hour urine started at 1830hrs. Patient and wife educated on process with translator.  ?

## 2021-12-16 DIAGNOSIS — R000 Tachycardia, unspecified: Secondary | ICD-10-CM | POA: Diagnosis present

## 2021-12-16 DIAGNOSIS — G8929 Other chronic pain: Secondary | ICD-10-CM | POA: Diagnosis present

## 2021-12-16 DIAGNOSIS — K219 Gastro-esophageal reflux disease without esophagitis: Secondary | ICD-10-CM | POA: Diagnosis present

## 2021-12-16 DIAGNOSIS — D649 Anemia, unspecified: Secondary | ICD-10-CM | POA: Diagnosis present

## 2021-12-16 DIAGNOSIS — E049 Nontoxic goiter, unspecified: Secondary | ICD-10-CM | POA: Diagnosis not present

## 2021-12-16 DIAGNOSIS — I340 Nonrheumatic mitral (valve) insufficiency: Secondary | ICD-10-CM | POA: Diagnosis not present

## 2021-12-16 DIAGNOSIS — I428 Other cardiomyopathies: Secondary | ICD-10-CM | POA: Diagnosis present

## 2021-12-16 DIAGNOSIS — M899 Disorder of bone, unspecified: Secondary | ICD-10-CM | POA: Diagnosis not present

## 2021-12-16 DIAGNOSIS — I671 Cerebral aneurysm, nonruptured: Secondary | ICD-10-CM | POA: Diagnosis present

## 2021-12-16 DIAGNOSIS — E119 Type 2 diabetes mellitus without complications: Secondary | ICD-10-CM | POA: Diagnosis present

## 2021-12-16 DIAGNOSIS — M1990 Unspecified osteoarthritis, unspecified site: Secondary | ICD-10-CM | POA: Diagnosis present

## 2021-12-16 DIAGNOSIS — I509 Heart failure, unspecified: Secondary | ICD-10-CM | POA: Diagnosis not present

## 2021-12-16 DIAGNOSIS — R471 Dysarthria and anarthria: Secondary | ICD-10-CM | POA: Diagnosis present

## 2021-12-16 DIAGNOSIS — M549 Dorsalgia, unspecified: Secondary | ICD-10-CM | POA: Diagnosis present

## 2021-12-16 DIAGNOSIS — Z87891 Personal history of nicotine dependence: Secondary | ICD-10-CM | POA: Diagnosis not present

## 2021-12-16 DIAGNOSIS — D696 Thrombocytopenia, unspecified: Secondary | ICD-10-CM | POA: Diagnosis present

## 2021-12-16 DIAGNOSIS — I4891 Unspecified atrial fibrillation: Secondary | ICD-10-CM | POA: Diagnosis not present

## 2021-12-16 DIAGNOSIS — R42 Dizziness and giddiness: Secondary | ICD-10-CM | POA: Diagnosis present

## 2021-12-16 DIAGNOSIS — I499 Cardiac arrhythmia, unspecified: Secondary | ICD-10-CM | POA: Diagnosis present

## 2021-12-16 DIAGNOSIS — I5031 Acute diastolic (congestive) heart failure: Secondary | ICD-10-CM | POA: Diagnosis not present

## 2021-12-16 DIAGNOSIS — I5033 Acute on chronic diastolic (congestive) heart failure: Secondary | ICD-10-CM | POA: Diagnosis present

## 2021-12-16 DIAGNOSIS — R011 Cardiac murmur, unspecified: Secondary | ICD-10-CM | POA: Diagnosis present

## 2021-12-16 DIAGNOSIS — I480 Paroxysmal atrial fibrillation: Secondary | ICD-10-CM | POA: Diagnosis present

## 2021-12-16 DIAGNOSIS — I272 Pulmonary hypertension, unspecified: Secondary | ICD-10-CM | POA: Diagnosis present

## 2021-12-16 DIAGNOSIS — E0590 Thyrotoxicosis, unspecified without thyrotoxic crisis or storm: Secondary | ICD-10-CM | POA: Diagnosis present

## 2021-12-16 DIAGNOSIS — G459 Transient cerebral ischemic attack, unspecified: Secondary | ICD-10-CM | POA: Diagnosis present

## 2021-12-16 LAB — COMPREHENSIVE METABOLIC PANEL
ALT: 21 U/L (ref 0–44)
AST: 24 U/L (ref 15–41)
Albumin: 2.9 g/dL — ABNORMAL LOW (ref 3.5–5.0)
Alkaline Phosphatase: 181 U/L — ABNORMAL HIGH (ref 38–126)
Anion gap: 6 (ref 5–15)
BUN: 14 mg/dL (ref 8–23)
CO2: 21 mmol/L — ABNORMAL LOW (ref 22–32)
Calcium: 8.7 mg/dL — ABNORMAL LOW (ref 8.9–10.3)
Chloride: 113 mmol/L — ABNORMAL HIGH (ref 98–111)
Creatinine, Ser: 0.64 mg/dL (ref 0.61–1.24)
GFR, Estimated: >60 mL/min (ref >60)
Glucose, Bld: 111 mg/dL — ABNORMAL HIGH (ref 70–99)
Potassium: 4.3 mmol/L (ref 3.5–5.1)
Sodium: 140 mmol/L (ref 135–145)
Total Bilirubin: 1.2 mg/dL (ref 0.3–1.2)
Total Protein: 6.2 g/dL — ABNORMAL LOW (ref 6.5–8.1)

## 2021-12-16 LAB — MICROALBUMIN / CREATININE URINE RATIO
Creatinine, Urine: 36.9 mg/dL
Microalb Creat Ratio: 62 mg/g creat — ABNORMAL HIGH (ref 0–29)
Microalb, Ur: 22.8 ug/mL — ABNORMAL HIGH

## 2021-12-16 LAB — CBC
HCT: 37 % — ABNORMAL LOW (ref 39.0–52.0)
Hemoglobin: 12.5 g/dL — ABNORMAL LOW (ref 13.0–17.0)
MCH: 29.7 pg (ref 26.0–34.0)
MCHC: 33.8 g/dL (ref 30.0–36.0)
MCV: 87.9 fL (ref 80.0–100.0)
Platelets: 121 10*3/uL — ABNORMAL LOW (ref 150–400)
RBC: 4.21 MIL/uL — ABNORMAL LOW (ref 4.22–5.81)
RDW: 14.7 % (ref 11.5–15.5)
WBC: 6.5 10*3/uL (ref 4.0–10.5)
nRBC: 0 % (ref 0.0–0.2)

## 2021-12-16 LAB — GLUCOSE, CAPILLARY
Glucose-Capillary: 161 mg/dL — ABNORMAL HIGH (ref 70–99)
Glucose-Capillary: 101 mg/dL — ABNORMAL HIGH (ref 70–99)
Glucose-Capillary: 114 mg/dL — ABNORMAL HIGH (ref 70–99)
Glucose-Capillary: 137 mg/dL — ABNORMAL HIGH (ref 70–99)

## 2021-12-16 LAB — KAPPA/LAMBDA LIGHT CHAINS
Kappa free light chain: 31.3 mg/L — ABNORMAL HIGH (ref 3.3–19.4)
Kappa, lambda light chain ratio: 1.47 (ref 0.26–1.65)
Lambda free light chains: 21.3 mg/L (ref 5.7–26.3)

## 2021-12-16 MED ORDER — DIGOXIN 125 MCG PO TABS
0.1250 mg | ORAL_TABLET | Freq: Every day | ORAL | Status: DC
  Administered 2021-12-16 – 2021-12-18 (×3): 0.125 mg via ORAL
  Filled 2021-12-16 (×3): qty 1

## 2021-12-16 MED ORDER — DILTIAZEM HCL-DEXTROSE 125-5 MG/125ML-% IV SOLN (PREMIX)
5.0000 mg/h | INTRAVENOUS | Status: DC
  Administered 2021-12-16: 5 mg/h via INTRAVENOUS
  Filled 2021-12-16: qty 125

## 2021-12-16 NOTE — Plan of Care (Signed)
  Problem: Activity: Goal: Ability to tolerate increased activity will improve Outcome: Progressing   Problem: Cardiac: Goal: Ability to achieve and maintain adequate cardiopulmonary perfusion will improve Outcome: Progressing   

## 2021-12-16 NOTE — Progress Notes (Signed)
?PROGRESS NOTE ? ? ? ?Gary Ryan  [id number] DOB: 1955/12/12 DOA: 12/14/2021 ?PCP: Pcp, No  ?65/M with history of type 2 diabetes mellitus, chronic back pain, GERD presented to the ED with fatigue, lightheadedness and intermittent's speech difficulty for a few weeks, wife also noticed leg swelling and facial swelling. ?-In the ED he was noted to be in A-fib RVR, CTA head and neck noted enlarged thyroid, heterogenous bone density, labs notable for platelet of 112K and BNP of 477 ?-Improving on diuretics, atenolol ?-Echo with preserved EF, mild PAH ? ?Subjective: ?-Feels a little better, breathing is improving ? ?Assessment and Plan: ? ?Atrial fibrillation with RVR  ?- Presents with 4 wks of lightheadedness and intermittent swelling and found to be in new-onset atrial fibrillation with RVR ?- CHADS-VASc may be only 1 (DM)  ?- TSH is <0.01 and free T4 elevated at 3.6, hyperthyroidism could be triggering this ?- Started on atenolol and methimazole ?-Appreciate cardiology input, started on Cardizem gtt. this morning, possible cardioversion tomorrow ? ?Acute diastolic CHF ?Moderate PAH ?-Bilateral pleural effusions, edema, dyspnea on exertion ?-Possibly triggered by A-fib RVR ?-Echo with preserved EF, moderate PAH ?-Improving, he is 1.5 L negative, continue low-dose IV Lasix today  ?-BMP in a.m. ?  ?Hyperthyroidism; enlarged thyroid   ?- TSH is <0.01 in ED, thyroid enlarged and heterogeneous on CT  ?-Free T4 is elevated, continue atenolol ?-Started on methimazole, recommended endocrinology follow-up and thyroid ultrasound,  ?  ?Thrombocytopenia  ?- Platelets 112k on admission mild, monitor ?  ?Abnormal bone density  ?- Diffusely heterogenous bone density noted incidentally on CT in ED  ?- Calcium slightly low, creatinine normal  ?-Follow-up myeloma panel ?-Will need further work-up, hematology follow-up ?  ?Type II DM  ?- A1c was 6.6% in October 2022  ?-Diet controlled, continue sliding scale insulin ?  ?Prolonged  QT interval  ?- QTc is 515 ms in ED  ?- Keep potassium 4 and mag 2, avoid QT-prolonging medications   ?  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  ?Dysarthria  ?- Patient reports 2 wks of intermittent dysarthria, most recently yesterday evening and ?- No acute intracranial abnormality noted on head CT, and no focal deficit identified on exam  ?-MRI brain unremarkable ?-?  TIA in the setting of A-fib, now resolved, monitor ?  ?  ?DVT prophylaxis: Eliquis  ?Code Status: Full  ?Family Communication: Wife at bedside yesterday ?Disposition Plan: Home likely 1 to 2 days ? ?Consultants:  ?Cardiology ? ?Procedures:  ? ?Antimicrobials:  ? ? ?Objective: ?Vitals:  ? 12/15/21 2016 12/16/21 0330 12/16/21 0949 12/16/21 1152  ?BP: 114/60 116/66 (!) 108/58 107/67  ?Pulse: (!) 105 79 74 (!) 53  ?Resp: 18 16 16    ?Temp: 98.3 ?F (36.8 ?C) 98.5 ?F (36.9 ?C) 98.6 ?F (37 ?C)   ?TempSrc: Oral Oral Oral   ?SpO2: 92% 95% 94% 94%  ?Weight:  49.1 kg    ?Height:      ? ? ?Intake/Output Summary (Last 24 hours) at 12/16/2021 1415 ?Last data filed at 12/16/2021 1400 ?Gross per 24 hour  ?Intake 1449.99 ml  ?Output 3170 ml  ?Net -1720.01 ml  ? ?Filed Weights  ? 12/14/21 2350 12/15/21 0350 12/16/21 0330  ?Weight: 54.4 kg 52.7 kg 49.1 kg  ? ? ?Examination: ? ?General exam: Pleasant thinly built male laying in bed, AAOx3, no distress ?CVS: S1-S2, irregularly irregular rhythm ?Lungs: Decreased  breath sounds bases ?Abdomen: Soft, nontender bowel sounds present ?Extremities: Trace edema ?Skin: No rashes ?Psychiatry:  Mood & affect appropriate.  ? ? ? ?Data Reviewed:  ? ?CBC: ?Recent Labs  ?Lab 12/14/21 ?1254 12/15/21 ?0411 12/16/21 ?[id number]  ?WBC 5.5 6.0 6.5  ?HGB 12.7* 13.1 12.5*  ?HCT 37.6* 38.7* 37.0*  ?MCV 87.6 87.8 87.9  ?PLT 112* 121* 121*  ? ?Basic Metabolic Panel: ?Recent Labs  ?Lab 12/14/21 ?1254 12/15/21 ?0411 12/16/21 ?[id number]  ?NA 138 135 140  ?K 3.9 4.3 4.3  ?CL 109 110 113*   ?CO2 23 22 21*  ?GLUCOSE 162* 155* 111*  ?BUN 12 11 14   ?CREATININE 0.43* 0.44* 0.64  ?CALCIUM 8.6* 8.9 8.7*  ?MG 1.8 2.0  --   ? ?GFR: ?Estimated Creatinine Clearance: 64.8 mL/min (by C-G formula based on SCr of 0.64 mg/dL). ?Liver Function Tests: ?Recent Labs  ?Lab 12/14/21 ?1254 12/16/21 ?[id number]  ?AST 32 24  ?ALT 23 21  ?ALKPHOS 223* 181*  ?BILITOT 1.6* 1.2  ?PROT 7.0 6.2*  ?ALBUMIN 3.5 2.9*  ? ?No results for input(s): LIPASE, AMYLASE in the last 168 hours. ?No results for input(s): AMMONIA in the last 168 hours. ?Coagulation Profile: ?No results for input(s): INR, PROTIME in the last 168 hours. ?Cardiac Enzymes: ?No results for input(s): CKTOTAL, CKMB, CKMBINDEX, TROPONINI in the last 168 hours. ?BNP (last 3 results) ?No results for input(s): PROBNP in the last 8760 hours. ?HbA1C: ?Recent Labs  ?  12/15/21 ?0411  ?HGBA1C 6.5*  ? ?CBG: ?Recent Labs  ?Lab 12/15/21 ?1146 12/15/21 ?1531 12/15/21 ?2203 12/16/21 ?0754 12/16/21 ?1147  ?GLUCAP 133* 171* 134* 161* 101*  ? ?Lipid Profile: ?Recent Labs  ?  12/15/21 ?1851 12/15/21 ?1855  ?CHOL  --  84  ?HDL  --  26*  ?[place]  --  51  ?TRIG  --  35  ?CHOLHDL  --  3.2  ?LDLDIRECT 50.1  --   ? ?Thyroid Function Tests: ?Recent Labs  ?  12/14/21 ?1425 12/15/21 ?0411  ?TSH <0.010*  --   ?FREET4  --  3.68*  ? ?Anemia Panel: ?No results for input(s): VITAMINB12, FOLATE, FERRITIN, TIBC, IRON, RETICCTPCT in the last 72 hours. ?Urine analysis: ?   ?Component Value Date/Time  ? [place] 12/14/2021 1254  ? APPEARANCEUR CLEAR 12/14/2021 1254  ? LABSPEC 1.025 12/14/2021 1254  ? PHURINE 7.0 12/14/2021 1254  ? GLUCOSEU 250 (A) 12/14/2021 1254  ? HGBUR NEGATIVE 12/14/2021 1254  ? BILIRUBINUR NEGATIVE 12/14/2021 1254  ? [place] NEGATIVE 12/14/2021 1254  ? PROTEINUR NEGATIVE 12/14/2021 1254  ? NITRITE NEGATIVE 12/14/2021 1254  ? LEUKOCYTESUR NEGATIVE 12/14/2021 1254  ? ?Sepsis Labs: ?@LABRCNTIP (procalcitonin:4,lacticidven:4) ? ?)No results found for this or any previous visit (from  the past 240 hour(s)).  ? ?Radiology Studies: ?CT ANGIO HEAD NECK W WO CM ? ?Result Date: 12/14/2021 ?CLINICAL DATA:  Neuro deficit, acute, stroke suspected. Weakness, dizziness, and facial swelling for 1 month. Increased symptoms today. EXAM: CT ANGIOGRAPHY HEAD AND NECK TECHNIQUE: Multidetector CT imaging of the head and neck was performed using the standard protocol during bolus administration of intravenous contrast. Multiplanar CT image reconstructions and MIPs were obtained to evaluate the vascular anatomy. Carotid stenosis measurements (when applicable) are obtained utilizing NASCET criteria, using the distal internal carotid diameter as the denominator. RADIATION DOSE REDUCTION: This exam was performed according to the departmental dose-optimization program which includes automated exposure control, adjustment of the mA and/or kV according to patient size and/or use of iterative reconstruction technique. CONTRAST:  42mL  OMNIPAQUE IOHEXOL 350 MG/ML SOLN COMPARISON:  None Available. FINDINGS: CT HEAD FINDINGS Brain: There is no evidence of an acute infarct, intracranial hemorrhage, mass, midline shift, or extra-axial fluid collection. The ventricles and sulci are within normal limits for age. Vascular: Reported below. Skull: No fracture. Sinuses: Visualized paranasal sinuses and mastoid air cells are clear. Orbits: Unremarkable. Review of the MIP images confirms the above findings CTA NECK FINDINGS Aortic arch: Standard 3 vessel aortic arch. Wide patency of the brachiocephalic and subclavian arteries. Right carotid system: Patent without evidence of stenosis, dissection, or significant atherosclerosis. Left carotid system: Patent without evidence of stenosis, dissection, or significant atherosclerosis. Vertebral arteries: Patent without evidence of stenosis, dissection, or significant atherosclerosis. Codominant. Skeleton: Multiple dental caries and periapical lucencies. Diffusely heterogeneous bone density.  Other neck: Moderate diffuse enlargement and mild heterogeneity of the thyroid gland. Upper chest: Partially visualized moderate right and small left pleural effusions with compressive atelectasis on the ri

## 2021-12-16 NOTE — Progress Notes (Signed)
Progress Note ? ?Patient Name: Gary Ryan ?Date of Encounter: 12/16/2021 ? ?Attending physician: Domenic Polite, MD ?Primary care provider: Pcp, No ? ?Subjective: ?Gary Ryan is a 66 y.o. male who was seen and examined at bedside  ?Wife a bedside  ?Used [employer] interpretation services during the encounter ?Dyspnea improved by 70% per patient.  ?No chest pain  ?Continues to have RVR w/ minimal activities (to and from bathroom) ?Case discussed and reviewed with his nurse. ? ?Objective: ?Vital Signs in the last 24 hours: ?Temp:  [97.6 ?F (36.4 ?C)-98.5 ?F (36.9 ?C)] 98.5 ?F (36.9 ?C) (05/11 0330) ?Pulse Rate:  [79-105] 79 (05/11 0330) ?Resp:  [16-20] 16 (05/11 0330) ?BP: (102-116)/(60-77) 116/66 (05/11 0330) ?SpO2:  [92 %-97 %] 95 % (05/11 0330) ?Weight:  [49.1 kg] 49.1 kg (05/11 0330) ? ?Intake/Output: ? ?Intake/Output Summary (Last 24 hours) at 12/16/2021 0757 ?Last data filed at 12/16/2021 0719 ?Gross per 24 hour  ?Intake 1200 ml  ?Output 3170 ml  ?Net -1970 ml  ?  ?Net IO Since Admission: -1,918.63 mL [12/16/21 0757] ? ?Weights:  ?Filed Weights  ? 12/14/21 2350 12/15/21 0350 12/16/21 0330  ?Weight: 54.4 kg 52.7 kg 49.1 kg  ? ? ?Telemetry: Personally reviewed. Afib w/ controlled ventricular rate.  ? ?Physical examination: ?PHYSICAL EXAM: ? ?  12/16/2021  ?  3:30 AM 12/15/2021  ?  8:16 PM 12/15/2021  ?  4:17 PM  ?Vitals with BMI  ?Weight 108 lbs 3 oz    ?BMI 18.56    ?Systolic 601 093 235  ?Diastolic 66 60 77  ?Pulse 79 105 98  ? ? ?General: Age-appropriate, predominately speaks [place], resting in bed comfortably, able to lay supine.  Hemodynamically stable, no acute distress ?HEENT: Normocephalic, atraumatic, positive JVP, moist oral membranes, mild thyromegaly noted ?Lungs: Clear to auscultation bilaterally no wheezes rales or rhonchi's. ?Heart: Irregularly irregular, variable T7-D2, holosystolic murmur heard at the apex, no gallops or rubs. ?Abdomen: Soft, nontender, nondistended, positive bowel sounds  in all 4 quadrants, ?Extremities: No pitting edema, warm to touch, 2+ DP and PT pulses. ?Neuro: Oriented to person place and time.  Nonfocal, normal muscle tone and range of motion ?Psych: Pleasant, cooperative, normal mood and affect. ? ?Lab Results: ?Hematology ?Recent Labs  ?Lab 12/14/21 ?1254 12/15/21 ?0411 12/16/21 ?2025  ?WBC 5.5 6.0 6.5  ?RBC 4.29 4.41 4.21*  ?HGB 12.7* 13.1 12.5*  ?HCT 37.6* 38.7* 37.0*  ?MCV 87.6 87.8 87.9  ?MCH 29.6 29.7 29.7  ?MCHC 33.8 33.9 33.8  ?RDW 14.8 14.6 14.7  ?PLT 112* 121* 121*  ? ? ?Chemistry ?Recent Labs  ?Lab 12/14/21 ?1254 12/15/21 ?0411 12/16/21 ?4270  ?NA 138 135 140  ?K 3.9 4.3 4.3  ?CL 109 110 113*  ?CO2 23 22 21*  ?GLUCOSE 162* 155* 111*  ?BUN $Rem'12 11 14  'CzOe$ ?CREATININE 0.43* 0.44* 0.64  ?CALCIUM 8.6* 8.9 8.7*  ?PROT 7.0  --  6.2*  ?ALBUMIN 3.5  --  2.9*  ?AST 32  --  24  ?ALT 23  --  21  ?ALKPHOS 223*  --  181*  ?BILITOT 1.6*  --  1.2  ?GFRNONAA >60 >60 >60  ?ANIONGAP 6 3* 6  ?  ? ?Cardiac Enzymes: ?Cardiac Panel (last 3 results) ?Recent Labs  ?  12/14/21 ?1254 12/14/21 ?1651  ?TROPONINIHS 9 9  ? ? ?BNP (last 3 results) ?Recent Labs  ?  12/14/21 ?1254  ?BNP 476.6*  ? ? ?ProBNP (last 3 results) ?No results for input(s): PROBNP in the  last 8760 hours. ? ? ?DDimer No results for input(s): DDIMER in the last 168 hours.  ? ?Hemoglobin A1c:  ?Lab Results  ?Component Value Date  ? HGBA1C 6.5 (H) 12/15/2021  ? MPG 139.85 12/15/2021  ? ? ?TSH  ?Recent Labs  ?  12/14/21 ?1425  ?TSH <0.010*  ? ? ?Lipid Panel  ?   ?Component Value Date/Time  ? CHOL 84 12/15/2021 1855  ? TRIG 35 12/15/2021 1855  ? HDL 26 (L) 12/15/2021 1855  ? CHOLHDL 3.2 12/15/2021 1855  ? VLDL 7 12/15/2021 1855  ? [place] 51 12/15/2021 1855  ? LDLDIRECT 50.1 12/15/2021 1851  ? ? ?Imaging: ?CT ANGIO HEAD NECK W WO CM ? ?Result Date: 12/14/2021 ?CLINICAL DATA:  Neuro deficit, acute, stroke suspected. Weakness, dizziness, and facial swelling for 1 month. Increased symptoms today. EXAM: CT ANGIOGRAPHY HEAD AND NECK  TECHNIQUE: Multidetector CT imaging of the head and neck was performed using the standard protocol during bolus administration of intravenous contrast. Multiplanar CT image reconstructions and MIPs were obtained to evaluate the vascular anatomy. Carotid stenosis measurements (when applicable) are obtained utilizing NASCET criteria, using the distal internal carotid diameter as the denominator. RADIATION DOSE REDUCTION: This exam was performed according to the departmental dose-optimization program which includes automated exposure control, adjustment of the mA and/or kV according to patient size and/or use of iterative reconstruction technique. CONTRAST:  45mL OMNIPAQUE IOHEXOL 350 MG/ML SOLN COMPARISON:  None Available. FINDINGS: CT HEAD FINDINGS Brain: There is no evidence of an acute infarct, intracranial hemorrhage, mass, midline shift, or extra-axial fluid collection. The ventricles and sulci are within normal limits for age. Vascular: Reported below. Skull: No fracture. Sinuses: Visualized paranasal sinuses and mastoid air cells are clear. Orbits: Unremarkable. Review of the MIP images confirms the above findings CTA NECK FINDINGS Aortic arch: Standard 3 vessel aortic arch. Wide patency of the brachiocephalic and subclavian arteries. Right carotid system: Patent without evidence of stenosis, dissection, or significant atherosclerosis. Left carotid system: Patent without evidence of stenosis, dissection, or significant atherosclerosis. Vertebral arteries: Patent without evidence of stenosis, dissection, or significant atherosclerosis. Codominant. Skeleton: Multiple dental caries and periapical lucencies. Diffusely heterogeneous bone density. Other neck: Moderate diffuse enlargement and mild heterogeneity of the thyroid gland. Upper chest: Partially visualized moderate right and small left pleural effusions with compressive atelectasis on the right. Review of the MIP images confirms the above findings CTA HEAD  FINDINGS Anterior circulation: The internal carotid arteries are widely patent from skull base to carotid termini there is a 2 mm inferiorly directed outpouching from the right paraclinoid ICA. ACAs and MCAs are patent without evidence of a proximal branch occlusion or significant proximal stenosis. Posterior circulation: The intracranial vertebral arteries are patent to the basilar. Patent bilateral PICA, right AICA, and bilateral SCA origins are seen. The basilar artery is widely patent. There are large posterior communicating arteries bilaterally with hypoplastic right and absent left P1 segments. Both PCAs are patent without evidence of a significant proximal stenosis. No aneurysm is identified. Venous sinuses: Patent. Anatomic variants: Fetal origin of the PCAs. Review of the MIP images confirms the above findings IMPRESSION: 1. No evidence of acute intracranial abnormality. 2. No large vessel occlusion or significant stenosis in the head and neck. 3. 2 mm right paraclinoid ICA aneurysm versus infundibulum. 4. Moderate right and small left pleural effusions. 5. Nonspecific diffusely heterogeneous bone density. If there is a history of malignancy, consider nuclear medicine whole-body bone scan to further evaluate for metastatic disease. Multiple myeloma  workup may also be worthwhile. 6. Incidental heterogeneous and enlarged thyroid. Recommend thyroid US. Reference: J Am Coll Radiol. 2015 Feb;12(2): 143-50 Electronically Signed   By: Logan Bores M.D.   On: 12/14/2021 14:38  ? ?DG Chest 2 View ? ?Result Date: 12/15/2021 ?CLINICAL DATA:  Hypoxia EXAM: CHEST - 2 VIEW COMPARISON:  May 25, 2021 FINDINGS: Mild cardiomegaly. Thoracic aorta is ectatic. Central pulmonary vascular congestion. Right pleural effusion. No focal consolidation. Bony thorax is unremarkable. IMPRESSION: Mild cardiomegaly, central pulmonary vascular congestion and right pleural effusion. Electronically Signed   By: Frazier Richards M.D.   On:  12/15/2021 10:37  ? ?MR BRAIN WO CONTRAST ? ?Result Date: 12/15/2021 ?CLINICAL DATA:  Initial evaluation for acute TIA. EXAM: MRI HEAD WITHOUT CONTRAST TECHNIQUE: Multiplanar, multiecho pulse sequences of the brai

## 2021-12-16 NOTE — Care Management (Signed)
1235 12-16-21 Case Manager spoke with patient via video interpreter to discuss consult for PCP needs. Patient is aware to contact [facility] via the [phone] number on his insurance card to get established with a PCP in network in his area. Patient did not have any additional questions for the Case Manager. No further needs identified at this time.  ?

## 2021-12-17 ENCOUNTER — Encounter (HOSPITAL_COMMUNITY): Payer: Self-pay | Admitting: Internal Medicine

## 2021-12-17 ENCOUNTER — Inpatient Hospital Stay (HOSPITAL_COMMUNITY): Payer: Commercial Managed Care - PPO | Admitting: Anesthesiology

## 2021-12-17 ENCOUNTER — Inpatient Hospital Stay (HOSPITAL_COMMUNITY): Payer: Commercial Managed Care - PPO

## 2021-12-17 ENCOUNTER — Encounter (HOSPITAL_COMMUNITY)
Admission: EM | Disposition: A | Discharge status: Home / Self Care | Payer: Self-pay | Source: Home / Self Care | Attending: Internal Medicine

## 2021-12-17 DIAGNOSIS — I5031 Acute diastolic (congestive) heart failure: Secondary | ICD-10-CM

## 2021-12-17 DIAGNOSIS — I4891 Unspecified atrial fibrillation: Secondary | ICD-10-CM

## 2021-12-17 DIAGNOSIS — E119 Type 2 diabetes mellitus without complications: Secondary | ICD-10-CM | POA: Diagnosis not present

## 2021-12-17 DIAGNOSIS — E0590 Thyrotoxicosis, unspecified without thyrotoxic crisis or storm: Secondary | ICD-10-CM

## 2021-12-17 DIAGNOSIS — I509 Heart failure, unspecified: Secondary | ICD-10-CM | POA: Diagnosis not present

## 2021-12-17 DIAGNOSIS — E1165 Type 2 diabetes mellitus with hyperglycemia: Secondary | ICD-10-CM

## 2021-12-17 DIAGNOSIS — I340 Nonrheumatic mitral (valve) insufficiency: Secondary | ICD-10-CM

## 2021-12-17 HISTORY — PX: CARDIOVERSION: SHX1299

## 2021-12-17 HISTORY — PX: BUBBLE STUDY: SHX6837

## 2021-12-17 HISTORY — PX: TEE WITHOUT CARDIOVERSION: SHX5443

## 2021-12-17 LAB — CBC
HCT: 41 % (ref 39.0–52.0)
Hemoglobin: 13.5 g/dL (ref 13.0–17.0)
MCH: 29.1 pg (ref 26.0–34.0)
MCHC: 32.9 g/dL (ref 30.0–36.0)
MCV: 88.4 fL (ref 80.0–100.0)
Platelets: 149 10*3/uL — ABNORMAL LOW (ref 150–400)
RBC: 4.64 MIL/uL (ref 4.22–5.81)
RDW: 14.1 % (ref 11.5–15.5)
WBC: 7.4 10*3/uL (ref 4.0–10.5)
nRBC: 0 % (ref 0.0–0.2)

## 2021-12-17 LAB — BASIC METABOLIC PANEL
Anion gap: 11 (ref 5–15)
BUN: 22 mg/dL (ref 8–23)
CO2: 19 mmol/L — ABNORMAL LOW (ref 22–32)
Calcium: 8.7 mg/dL — ABNORMAL LOW (ref 8.9–10.3)
Chloride: 105 mmol/L (ref 98–111)
Creatinine, Ser: 0.73 mg/dL (ref 0.61–1.24)
GFR, Estimated: >60 mL/min (ref >60)
Glucose, Bld: 88 mg/dL (ref 70–99)
Potassium: 4.1 mmol/L (ref 3.5–5.1)
Sodium: 135 mmol/L (ref 135–145)

## 2021-12-17 LAB — GLUCOSE, CAPILLARY
Glucose-Capillary: 86 mg/dL (ref 70–99)
Glucose-Capillary: 98 mg/dL (ref 70–99)
Glucose-Capillary: 267 mg/dL — ABNORMAL HIGH (ref 70–99)
Glucose-Capillary: 237 mg/dL — ABNORMAL HIGH (ref 70–99)

## 2021-12-17 LAB — T3, FREE: T3, Free: 14.8 pg/mL — ABNORMAL HIGH (ref 2.0–4.4)

## 2021-12-17 SURGERY — ECHOCARDIOGRAM, TRANSESOPHAGEAL
Anesthesia: General

## 2021-12-17 MED ORDER — SODIUM CHLORIDE 0.9 % IV SOLN: INTRAVENOUS | Status: DC

## 2021-12-17 MED ORDER — PHENYLEPHRINE 80 MCG/ML (10ML) SYRINGE FOR IV PUSH (FOR BLOOD PRESSURE SUPPORT)
PREFILLED_SYRINGE | INTRAVENOUS | Status: DC | PRN
  Administered 2021-12-17: 80 ug via INTRAVENOUS
  Administered 2021-12-17: 160 ug via INTRAVENOUS
  Administered 2021-12-17 (×3): 80 ug via INTRAVENOUS

## 2021-12-17 MED ORDER — FUROSEMIDE 10 MG/ML IJ SOLN
20.0000 mg | Freq: Two times a day (BID) | INTRAMUSCULAR | Status: AC
  Administered 2021-12-17: 20 mg via INTRAVENOUS
  Filled 2021-12-17: qty 2

## 2021-12-17 MED ORDER — LIDOCAINE 2% (20 MG/ML) 5 ML SYRINGE
INTRAMUSCULAR | Status: DC | PRN
  Administered 2021-12-17: 100 mg via INTRAVENOUS

## 2021-12-17 MED ORDER — PROPOFOL 10 MG/ML IV BOLUS
INTRAVENOUS | Status: DC | PRN
  Administered 2021-12-17: 30 mg via INTRAVENOUS
  Administered 2021-12-17: 40 mg via INTRAVENOUS
  Administered 2021-12-17: 30 mg via INTRAVENOUS
  Administered 2021-12-17: 20 mg via INTRAVENOUS
  Administered 2021-12-17: 30 mg via INTRAVENOUS

## 2021-12-17 MED ORDER — PROPOFOL 500 MG/50ML IV EMUL
INTRAVENOUS | Status: DC | PRN
  Administered 2021-12-17: 125 ug/kg/min via INTRAVENOUS

## 2021-12-17 NOTE — Interval H&P Note (Signed)
History and Physical Interval Note: ? ?12/17/2021 ?12:34 PM ? ?Gary Ryan  has presented today for surgery, with the diagnosis of afib.  The various methods of treatment have been discussed with the patient and family. After consideration of risks, benefits and other options for treatment, the patient has consented to  Procedure(s): ?TRANSESOPHAGEAL ECHOCARDIOGRAM (TEE) (N/A) ?CARDIOVERSION (N/A) as a surgical intervention.  The patient's history has been reviewed, patient examined, no change in status, stable for surgery.  I have reviewed the patient's chart and labs.  Questions were answered to the patient's satisfaction.   ? ? ?[name], DO, FACC ? ?Pager: [phone] ?Office: [phone] ?12:34 PM ?12/17/21 ? ? ? ?

## 2021-12-17 NOTE — H&P (View-Only) (Signed)
Progress Note ? ?Patient Name: Gary Ryan ?Date of Encounter: 12/17/2021 ? ?Attending physician: Domenic Polite, MD ?Primary care provider: Pcp, No ? ?Subjective: ?Gary Ryan is a 66 y.o. male who was seen and examined at bedside  ?Wife a bedside  ?Used [employer] interpretation services during the encounter ?Shortness of breath has improved significantly. ?Yesterday he did better with ambulation as his heart rate remained less than 100 bpm. ?However, just talking to him at rest he is noted to have heart rates around 110 bpm. ?Case discussed and reviewed with his nurse. ? ?Objective: ?Vital Signs in the last 24 hours: ?Temp:  [98.1 ?F (36.7 ?C)-98.6 ?F (37 ?C)] 98.4 ?F (36.9 ?C) (05/12 [id number]) ?Pulse Rate:  [53-89] 89 (05/12 0709) ?Resp:  [16] 16 (05/12 0709) ?BP: (103-116)/(54-68) 106/54 (05/12 0709) ?SpO2:  [94 %-99 %] 95 % (05/12 0709) ? ?Intake/Output: ? ?Intake/Output Summary (Last 24 hours) at 12/17/2021 0931 ?Last data filed at 12/17/2021 0555 ?Gross per 24 hour  ?Intake 609.99 ml  ?Output 1840 ml  ?Net -1230.01 ml  ?  ?Net IO Since Admission: -2,908.64 mL [12/17/21 0931] ? ?Weights:  ?Filed Weights  ? 12/14/21 2350 12/15/21 0350 12/16/21 0330  ?Weight: 54.4 kg 52.7 kg 49.1 kg  ? ? ?Telemetry: Personally reviewed. Afib w/ controlled ventricular rate.  ? ?Physical examination: ?PHYSICAL EXAM: ? ?  12/17/2021  ?  7:09 AM 12/17/2021  ?  4:16 AM 12/16/2021  ?  8:39 PM  ?Vitals with BMI  ?Systolic [id number] 0000000  ?Diastolic 54 68 64  ?Pulse 89 80 88  ? ? ?General: Age-appropriate, predominately speaks [place], resting in bed comfortably, able to lay supine.  Hemodynamically stable, no acute distress ?HEENT: Normocephalic, atraumatic, positive JVP, moist oral membranes, mild thyromegaly noted ?Lungs: Clear to auscultation bilaterally no wheezes rales or rhonchi's. ?Heart: Irregularly irregular, variable Q000111Q, holosystolic murmur heard at the apex, no gallops or rubs. ?Abdomen: Soft, nontender, nondistended,  positive bowel sounds in all 4 quadrants, ?Extremities: No pitting edema, warm to touch, 2+ DP and PT pulses. ?Neuro: Oriented to person place and time.  Nonfocal, normal muscle tone and range of motion ?Psych: Pleasant, cooperative, normal mood and affect. ? ?Lab Results: ?Hematology ?Recent Labs  ?Lab 12/15/21 ?0411 12/16/21 ?[id number] 12/17/21 ?0259  ?WBC 6.0 6.5 7.4  ?RBC 4.41 4.21* 4.64  ?HGB 13.1 12.5* 13.5  ?HCT 38.7* 37.0* 41.0  ?MCV 87.8 87.9 88.4  ?MCH 29.7 29.7 29.1  ?MCHC 33.9 33.8 32.9  ?RDW 14.6 14.7 14.1  ?PLT 121* 121* 149*  ? ? ?Chemistry ?Recent Labs  ?Lab 12/14/21 ?1254 12/15/21 ?0411 12/16/21 ?[id number] 12/17/21 ?0259  ?NA 138 135 140 135  ?K 3.9 4.3 4.3 4.1  ?CL 109 110 113* 105  ?CO2 23 22 21* 19*  ?GLUCOSE 162* 155* 111* 88  ?BUN 12 11 14 22   ?CREATININE 0.43* 0.44* 0.64 0.73  ?CALCIUM 8.6* 8.9 8.7* 8.7*  ?PROT 7.0  --  6.2*  --   ?ALBUMIN 3.5  --  2.9*  --   ?AST 32  --  24  --   ?ALT 23  --  21  --   ?ALKPHOS 223*  --  181*  --   ?BILITOT 1.6*  --  1.2  --   ?GFRNONAA >60 >60 >60 >60  ?ANIONGAP 6 3* 6 11  ?  ? ?Cardiac Enzymes: ?Cardiac Panel (last 3 results) ?Recent Labs  ?  12/14/21 ?1254 12/14/21 ?1651  ?TROPONINIHS 9 9  ? ? ?BNP (last 3  results) ?Recent Labs  ?  12/14/21 ?1254  ?BNP 476.6*  ? ? ?ProBNP (last 3 results) ?No results for input(s): PROBNP in the last 8760 hours. ? ? ?DDimer No results for input(s): DDIMER in the last 168 hours.  ? ?Hemoglobin A1c:  ?Lab Results  ?Component Value Date  ? HGBA1C 6.5 (H) 12/15/2021  ? MPG 139.85 12/15/2021  ? ? ?TSH  ?Recent Labs  ?  12/14/21 ?1425  ?TSH <0.010*  ? ? ?Lipid Panel  ?   ?Component Value Date/Time  ? CHOL 84 12/15/2021 1855  ? TRIG 35 12/15/2021 1855  ? HDL 26 (L) 12/15/2021 1855  ? CHOLHDL 3.2 12/15/2021 1855  ? VLDL 7 12/15/2021 1855  ? [place] 51 12/15/2021 1855  ? LDLDIRECT 50.1 12/15/2021 1851  ? ? ?Imaging: ?DG Chest 2 View ? ?Result Date: 12/15/2021 ?CLINICAL DATA:  Hypoxia EXAM: CHEST - 2 VIEW COMPARISON:  May 25, 2021 FINDINGS:  Mild cardiomegaly. Thoracic aorta is ectatic. Central pulmonary vascular congestion. Right pleural effusion. No focal consolidation. Bony thorax is unremarkable. IMPRESSION: Mild cardiomegaly, central pulmonary vascular congestion and right pleural effusion. Electronically Signed   By: Frazier Richards M.D.   On: 12/15/2021 10:37  ? ?ECHOCARDIOGRAM COMPLETE ? ?Result Date: 12/15/2021 ?   ECHOCARDIOGRAM REPORT   Patient Name:   Gary Ryan Date of Exam: 12/15/2021 Medical Rec #:  [id number]        Height:       64.0 in Accession #:    [id number]       Weight:       116.1 lb Date of Birth:  Sep 27, 1955        BSA:          1.553 m? Patient Age:    20 years         BP:           102/65 mmHg Patient Gender: M                HR:           95 bpm. Exam Location:  Inpatient Procedure: 2D Echo Indications:    acute diastolic chf  History:        Patient has no prior history of Echocardiogram examinations.                 CHF, Arrythmias:Atrial Fibrillation; Risk Factors:Diabetes.  Sonographer:    Johny Chess RDCS Referring Phys: [id number] [place]  1. Left ventricular ejection fraction, by estimation, is 60 to 65%. The left ventricle has normal function. The left ventricle has no regional wall motion abnormalities. Diastolic function indeterminant due to AFib.  2. Right ventricular systolic function is mildly reduced. The right ventricular size is mildly enlarged. There is moderately elevated pulmonary artery systolic pressure. The estimated right ventricular systolic pressure is Q000111Q mmHg.  3. Left atrial size was moderately dilated.  4. Right atrial size was moderately dilated.  5. The mitral valve is grossly normal. There is moderate-to-severe functional mitral regurgitation.  6. Tricuspid valve regurgitation is moderate to severe. Appears to be functional with poor central coaptation of the TV leaflets.  7. The aortic valve is tricuspid. There is mild calcification of the aortic valve. There is mild  thickening of the aortic valve. Aortic valve regurgitation is trivial. Aortic valve sclerosis/calcification is present, without any evidence of aortic stenosis.  8. The inferior vena cava is dilated in size with <50% respiratory variability, suggesting right atrial pressure of  15 mmHg. Comparison(s): No prior Echocardiogram. FINDINGS  Left Ventricle: Left ventricular ejection fraction, by estimation, is 60 to 65%. The left ventricle has normal function. The left ventricle has no regional wall motion abnormalities. The left ventricular internal cavity size was normal in size. There is  no left ventricular hypertrophy. Diastolic function indeterminant due to AFib. Right Ventricle: The right ventricular size is mildly enlarged. No increase in right ventricular wall thickness. Right ventricular systolic function is mildly reduced. There is moderately elevated pulmonary artery systolic pressure. The tricuspid regurgitant velocity is 2.96 m/s, and with an assumed right atrial pressure of 15 mmHg, the estimated right ventricular systolic pressure is Q000111Q mmHg. Left Atrium: Left atrial size was moderately dilated. Right Atrium: Right atrial size was moderately dilated. Pericardium: There is no evidence of pericardial effusion. Mitral Valve: The mitral valve is grossly normal. There is mild thickening of the mitral valve leaflet(s). There is mild calcification of the mitral valve leaflet(s). Moderate to severe mitral valve regurgitation. Tricuspid Valve: The tricuspid valve is normal in structure. Tricuspid valve regurgitation is moderate to severe. Aortic Valve: The aortic valve is tricuspid. There is mild calcification of the aortic valve. There is mild thickening of the aortic valve. Aortic valve regurgitation is trivial. Aortic valve sclerosis/calcification is present, without any evidence of aortic stenosis. Pulmonic Valve: The pulmonic valve was normal in structure. Pulmonic valve regurgitation is mild. Aorta: The  aortic root and ascending aorta are structurally normal, with no evidence of dilitation. Venous: The inferior vena cava is dilated in size with less than 50% respiratory variability, suggesting right atrial pr

## 2021-12-17 NOTE — Plan of Care (Signed)
  Problem: Activity: Goal: Ability to tolerate increased activity will improve Outcome: Progressing   Problem: Cardiac: Goal: Ability to achieve and maintain adequate cardiopulmonary perfusion will improve Outcome: Progressing   

## 2021-12-17 NOTE — Progress Notes (Signed)
Progress Note ? ?Patient Name: Gary Ryan ?Date of Encounter: 12/17/2021 ? ?Attending physician: Domenic Polite, MD ?Primary care provider: Pcp, No ? ?Subjective: ?Gary Ryan is a 66 y.o. male who was seen and examined at bedside  ?Wife a bedside  ?Used [employer] interpretation services during the encounter ?Shortness of breath has improved significantly. ?Yesterday he did better with ambulation as his heart rate remained less than 100 bpm. ?However, just talking to him at rest he is noted to have heart rates around 110 bpm. ?Case discussed and reviewed with his nurse. ? ?Objective: ?Vital Signs in the last 24 hours: ?Temp:  [98.1 ?F (36.7 ?C)-98.6 ?F (37 ?C)] 98.4 ?F (36.9 ?C) (05/12 [id number]) ?Pulse Rate:  [53-89] 89 (05/12 0709) ?Resp:  [16] 16 (05/12 0709) ?BP: (103-116)/(54-68) 106/54 (05/12 0709) ?SpO2:  [94 %-99 %] 95 % (05/12 0709) ? ?Intake/Output: ? ?Intake/Output Summary (Last 24 hours) at 12/17/2021 0931 ?Last data filed at 12/17/2021 0555 ?Gross per 24 hour  ?Intake 609.99 ml  ?Output 1840 ml  ?Net -1230.01 ml  ?  ?Net IO Since Admission: -2,908.64 mL [12/17/21 0931] ? ?Weights:  ?Filed Weights  ? 12/14/21 2350 12/15/21 0350 12/16/21 0330  ?Weight: 54.4 kg 52.7 kg 49.1 kg  ? ? ?Telemetry: Personally reviewed. Afib w/ controlled ventricular rate.  ? ?Physical examination: ?PHYSICAL EXAM: ? ?  12/17/2021  ?  7:09 AM 12/17/2021  ?  4:16 AM 12/16/2021  ?  8:39 PM  ?Vitals with BMI  ?Systolic A999333 [id number] 0000000  ?Diastolic 54 68 64  ?Pulse 89 80 88  ? ? ?General: Age-appropriate, predominately speaks [place], resting in bed comfortably, able to lay supine.  Hemodynamically stable, no acute distress ?HEENT: Normocephalic, atraumatic, positive JVP, moist oral membranes, mild thyromegaly noted ?Lungs: Clear to auscultation bilaterally no wheezes rales or rhonchi's. ?Heart: Irregularly irregular, variable Q000111Q, holosystolic murmur heard at the apex, no gallops or rubs. ?Abdomen: Soft, nontender, nondistended,  positive bowel sounds in all 4 quadrants, ?Extremities: No pitting edema, warm to touch, 2+ DP and PT pulses. ?Neuro: Oriented to person place and time.  Nonfocal, normal muscle tone and range of motion ?Psych: Pleasant, cooperative, normal mood and affect. ? ?Lab Results: ?Hematology ?Recent Labs  ?Lab 12/15/21 ?0411 12/16/21 ?[id number] 12/17/21 ?0259  ?WBC 6.0 6.5 7.4  ?RBC 4.41 4.21* 4.64  ?HGB 13.1 12.5* 13.5  ?HCT 38.7* 37.0* 41.0  ?MCV 87.8 87.9 88.4  ?MCH 29.7 29.7 29.1  ?MCHC 33.9 33.8 32.9  ?RDW 14.6 14.7 14.1  ?PLT 121* 121* 149*  ? ? ?Chemistry ?Recent Labs  ?Lab 12/14/21 ?1254 12/15/21 ?0411 12/16/21 ?[id number] 12/17/21 ?0259  ?NA 138 135 140 135  ?K 3.9 4.3 4.3 4.1  ?CL 109 110 113* 105  ?CO2 23 22 21* 19*  ?GLUCOSE 162* 155* 111* 88  ?BUN 12 11 14 22   ?CREATININE 0.43* 0.44* 0.64 0.73  ?CALCIUM 8.6* 8.9 8.7* 8.7*  ?PROT 7.0  --  6.2*  --   ?ALBUMIN 3.5  --  2.9*  --   ?AST 32  --  24  --   ?ALT 23  --  21  --   ?ALKPHOS 223*  --  181*  --   ?BILITOT 1.6*  --  1.2  --   ?GFRNONAA >60 >60 >60 >60  ?ANIONGAP 6 3* 6 11  ?  ? ?Cardiac Enzymes: ?Cardiac Panel (last 3 results) ?Recent Labs  ?  12/14/21 ?1254 12/14/21 ?1651  ?TROPONINIHS 9 9  ? ? ?BNP (last 3  results) ?Recent Labs  ?  12/14/21 ?1254  ?BNP 476.6*  ? ? ?ProBNP (last 3 results) ?No results for input(s): PROBNP in the last 8760 hours. ? ? ?DDimer No results for input(s): DDIMER in the last 168 hours.  ? ?Hemoglobin A1c:  ?Lab Results  ?Component Value Date  ? HGBA1C 6.5 (H) 12/15/2021  ? MPG 139.85 12/15/2021  ? ? ?TSH  ?Recent Labs  ?  12/14/21 ?1425  ?TSH <0.010*  ? ? ?Lipid Panel  ?   ?Component Value Date/Time  ? CHOL 84 12/15/2021 1855  ? TRIG 35 12/15/2021 1855  ? HDL 26 (L) 12/15/2021 1855  ? CHOLHDL 3.2 12/15/2021 1855  ? VLDL 7 12/15/2021 1855  ? [place] 51 12/15/2021 1855  ? LDLDIRECT 50.1 12/15/2021 1851  ? ? ?Imaging: ?DG Chest 2 View ? ?Result Date: 12/15/2021 ?CLINICAL DATA:  Hypoxia EXAM: CHEST - 2 VIEW COMPARISON:  May 25, 2021 FINDINGS:  Mild cardiomegaly. Thoracic aorta is ectatic. Central pulmonary vascular congestion. Right pleural effusion. No focal consolidation. Bony thorax is unremarkable. IMPRESSION: Mild cardiomegaly, central pulmonary vascular congestion and right pleural effusion. Electronically Signed   By: Frazier Richards M.D.   On: 12/15/2021 10:37  ? ?ECHOCARDIOGRAM COMPLETE ? ?Result Date: 12/15/2021 ?   ECHOCARDIOGRAM REPORT   Patient Name:   Gary Ryan Date of Exam: 12/15/2021 Medical Rec #:  [id number]        Height:       64.0 in Accession #:    [id number]       Weight:       116.1 lb Date of Birth:  1956-08-06        BSA:          1.553 m? Patient Age:    45 years         BP:           102/65 mmHg Patient Gender: M                HR:           95 bpm. Exam Location:  Inpatient Procedure: 2D Echo Indications:    acute diastolic chf  History:        Patient has no prior history of Echocardiogram examinations.                 CHF, Arrythmias:Atrial Fibrillation; Risk Factors:Diabetes.  Sonographer:    Johny Chess RDCS Referring Phys: [id number] [place]  1. Left ventricular ejection fraction, by estimation, is 60 to 65%. The left ventricle has normal function. The left ventricle has no regional wall motion abnormalities. Diastolic function indeterminant due to AFib.  2. Right ventricular systolic function is mildly reduced. The right ventricular size is mildly enlarged. There is moderately elevated pulmonary artery systolic pressure. The estimated right ventricular systolic pressure is Q000111Q mmHg.  3. Left atrial size was moderately dilated.  4. Right atrial size was moderately dilated.  5. The mitral valve is grossly normal. There is moderate-to-severe functional mitral regurgitation.  6. Tricuspid valve regurgitation is moderate to severe. Appears to be functional with poor central coaptation of the TV leaflets.  7. The aortic valve is tricuspid. There is mild calcification of the aortic valve. There is mild  thickening of the aortic valve. Aortic valve regurgitation is trivial. Aortic valve sclerosis/calcification is present, without any evidence of aortic stenosis.  8. The inferior vena cava is dilated in size with <50% respiratory variability, suggesting right atrial pressure of  15 mmHg. Comparison(s): No prior Echocardiogram. FINDINGS  Left Ventricle: Left ventricular ejection fraction, by estimation, is 60 to 65%. The left ventricle has normal function. The left ventricle has no regional wall motion abnormalities. The left ventricular internal cavity size was normal in size. There is  no left ventricular hypertrophy. Diastolic function indeterminant due to AFib. Right Ventricle: The right ventricular size is mildly enlarged. No increase in right ventricular wall thickness. Right ventricular systolic function is mildly reduced. There is moderately elevated pulmonary artery systolic pressure. The tricuspid regurgitant velocity is 2.96 m/s, and with an assumed right atrial pressure of 15 mmHg, the estimated right ventricular systolic pressure is Q000111Q mmHg. Left Atrium: Left atrial size was moderately dilated. Right Atrium: Right atrial size was moderately dilated. Pericardium: There is no evidence of pericardial effusion. Mitral Valve: The mitral valve is grossly normal. There is mild thickening of the mitral valve leaflet(s). There is mild calcification of the mitral valve leaflet(s). Moderate to severe mitral valve regurgitation. Tricuspid Valve: The tricuspid valve is normal in structure. Tricuspid valve regurgitation is moderate to severe. Aortic Valve: The aortic valve is tricuspid. There is mild calcification of the aortic valve. There is mild thickening of the aortic valve. Aortic valve regurgitation is trivial. Aortic valve sclerosis/calcification is present, without any evidence of aortic stenosis. Pulmonic Valve: The pulmonic valve was normal in structure. Pulmonic valve regurgitation is mild. Aorta: The  aortic root and ascending aorta are structurally normal, with no evidence of dilitation. Venous: The inferior vena cava is dilated in size with less than 50% respiratory variability, suggesting right atrial pr

## 2021-12-17 NOTE — Anesthesia Procedure Notes (Signed)
Procedure Name: General with mask airway ?Date/Time: 12/17/2021 12:45 PM ?Performed by: Erick Colace, CRNA ?Pre-anesthesia Checklist: Patient identified, Emergency Drugs available, Suction available and Patient being monitored ?Patient Re-evaluated:Patient Re-evaluated prior to induction ?Oxygen Delivery Method: Nasal cannula ?Preoxygenation: Pre-oxygenation with 100% oxygen ?Induction Type: IV induction ?Airway Equipment and Method: Bite block ?Dental Injury: Teeth and Oropharynx as per pre-operative assessment  ? ? ? ? ?

## 2021-12-17 NOTE — CV Procedure (Signed)
Transesophageal echocardiogram (TEE) : Preliminary report ?12/17/21 ? ?Sedation: See anesthesia records.  ? ?TEE was performed without complications ?  ?LV: Normal EF. ?RV: Normal structure and function.   ?LA: Mildly dilated.  Spontaneous echo contrast was not present.  No thrombus present. ?Left atrial appendage: Spontaneous echo contrast was not  present.  No thrombus present. ?Inter atrial septum is intact without defect. Double contrast study negative for atrial level shunting. ?RA: Grossly normal.   ?  ?MV: mild regurgitation,no stenosis, no vegetation noted.  ?TV: trivial regurgitation,no stenosis, no vegetation noted. ?AV: no regurgitation,no stenosis, no vegetation noted.   ?PV: trivial regurgitation, no stenosis, no vegetation noted. ?  ?Thoracic and ascending aorta: No significant plaque. ? ?Final report forthcoming. ? ?Tessa Lerner, DO, FACC ? ?Pager: [phone] ?Office: [phone]  ? ?Direct current cardioversion: ? ?Indication symptomatic: Symptomatic atrial fibrillation ? ?Procedure Details: ? ?Consent: Risks of procedure as well as the alternatives and risks of each were explained to the (patient/caregiver).  Consent for procedure obtained. [employer] interpretation services utilized.  ? ?Time Out: Verified patient identification, verified procedure, site/side was marked, verified correct patient position, special equipment/implants available, medications/allergies/relevent history reviewed, required imaging and test results available. PERFORMED. ? ?Patient placed on cardiac monitor, pulse oximetry, supplemental oxygen as necessary.  ?Sedation given:  see anesthesia records. ?Pacer pads placed anterior and posterior chest. ? ?Cardioverted 3 time(s).  ?Cardioversion with synchronized biphasic 200J shock. ? ?Evaluation: ?Findings: Post procedure EKG shows:  sinus bradycardia ?Complications: None ?Patient did tolerate procedure well. ? ? ?Jiali Linney ?12/17/2021, 1:37 PM ? ?

## 2021-12-17 NOTE — Progress Notes (Signed)
?PROGRESS NOTE ? ? ? ?Gary Ryan  [id number] DOB: 1955/12/24 DOA: 12/14/2021 ?PCP: Pcp, No  ?65/M with history of type 2 diabetes mellitus, chronic back pain, GERD presented to the ED with fatigue, lightheadedness and intermittent's speech difficulty for a few weeks, wife also noticed leg swelling and facial swelling. ?-In the ED he was noted to be in A-fib RVR, CTA head and neck noted enlarged thyroid, heterogenous bone density, labs notable for platelet of 112K and BNP of 477 ?-Improving on diuretics, atenolol ?-Echo with preserved EF, mild PAH ? ?Subjective: ?-Feels significantly better, breathing almost back to normal ? ?Assessment and Plan: ? ?Atrial fibrillation with RVR  ?- Presents with 4 wks of lightheadedness and intermittent swelling and found to be in new-onset atrial fibrillation with RVR ?- CHADS-VASc may be only 1 (DM)  ?- TSH is <0.01 and free T4 elevated at 3.6, hyperthyroidism could be triggering this ?- Started on atenolol and methimazole ?-Appreciate cardiology input, started on Cardizem gtt. yesterday, plan for TEE/cardioversion today ?-Continue apixaban ? ?Acute diastolic CHF ?Moderate PAH ?-Bilateral pleural effusions, edema, dyspnea on exertion ?-Possibly triggered by A-fib RVR ?-Echo with preserved EF, moderate PAH ?-Improving, he is 3 L negative, clinically appears close to euvolemic, transition to oral diuretics at discharge ?  ?Hyperthyroidism; enlarged thyroid   ?- TSH is <0.01 in ED, thyroid enlarged and heterogeneous on CT  ?-Free T4 is elevated, continue atenolol ?-Started on methimazole, recommended endocrinology follow-up and thyroid ultrasound,  ?  ?Thrombocytopenia  ?- Platelets 112k on admission mild, monitor ?  ?Abnormal bone density  ?- Diffusely heterogenous bone density noted incidentally on CT in ED  ?- Calcium slightly low, creatinine normal  ?-Follow-up myeloma panel, kappa free light chains mildly elevated, ratio was normal, ?-Will need further work-up, hematology  follow-up ?  ?Type II DM  ?- A1c was 6.6% in October 2022  ?-Diet controlled, continue sliding scale insulin ?  ?Prolonged QT interval  ?- QTc is 515 ms in ED  ?- Keep potassium 4 and mag 2, avoid QT-prolonging medications   ?  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  ?Dysarthria  ?- Patient reports 2 wks of intermittent dysarthria, most recently yesterday evening and ?- No acute intracranial abnormality noted on head CT, and no focal deficit identified on exam  ?-MRI brain unremarkable ?-?  TIA in the setting of A-fib, now resolved, monitor ?  ?  ?DVT prophylaxis: Eliquis  ?Code Status: Full  ?Family Communication: Wife at bedside yesterday ?Disposition Plan: Home possibly tomorrow ? ?Consultants:  ?Cardiology ? ?Procedures:  ? ?Antimicrobials:  ? ? ?Objective: ?Vitals:  ? 12/17/21 0416 12/17/21 0709 12/17/21 1127 12/17/21 1200  ?BP: 116/68 (!) 106/54 107/72 113/61  ?Pulse: 80 89 84 86  ?Resp:  16 16 (!) 22  ?Temp: 98.1 ?F (36.7 ?C) 98.4 ?F (36.9 ?C) 97.6 ?F (36.4 ?C) (!) 97.4 ?F (36.3 ?C)  ?TempSrc: Oral Oral Oral Temporal  ?SpO2: 96% 95% 95% 98%  ?Weight:    49.1 kg  ?Height:    5\' 4"  (1.626 m)  ? ? ?Intake/Output Summary (Last 24 hours) at 12/17/2021 1215 ?Last data filed at 12/17/2021 1100 ?Gross per 24 hour  ?Intake 291.5 ml  ?Output 1840 ml  ?Net -1548.5 ml  ? ?Filed Weights  ? 12/15/21 0350 12/16/21 0330 12/17/21 1200  ?Weight: 52.7 kg 49.1 kg 49.1 kg  ? ? ?Examination: ? ?General exam:  Pleasant thinly built male laying in bed, AAOx3, no distress ?CVS: S1-S2, irregularly irregular rhythm ?Lungs: Decreased breath sounds to bases ?Abdomen: Soft, nontender, bowel sounds present ?Extremities: Trace edema  ?Skin: No rashes ?Psychiatry:  Mood & affect appropriate.  ? ? ? ?Data Reviewed:  ? ?CBC: ?Recent Labs  ?Lab 12/14/21 ?1254 12/15/21 ?0411 12/16/21 ?[id number] 12/17/21 ?0259  ?WBC 5.5 6.0 6.5 7.4  ?HGB 12.7* 13.1 12.5* 13.5  ?HCT 37.6* 38.7* 37.0*  41.0  ?MCV 87.6 87.8 87.9 88.4  ?PLT 112* 121* 121* 149*  ? ?Basic Metabolic Panel: ?Recent Labs  ?Lab 12/14/21 ?1254 12/15/21 ?0411 12/16/21 ?[id number] 12/17/21 ?0259  ?NA 138 135 140 135  ?K 3.9 4.3 4.3 4.1  ?CL 109 110 113* 105  ?CO2 23 22 21* 19*  ?GLUCOSE 162* 155* 111* 88  ?BUN 12 11 14 22   ?CREATININE 0.43* 0.44* 0.64 0.73  ?CALCIUM 8.6* 8.9 8.7* 8.7*  ?MG 1.8 2.0  --   --   ? ?GFR: ?Estimated Creatinine Clearance: 64.8 mL/min (by C-G formula based on SCr of 0.73 mg/dL). ?Liver Function Tests: ?Recent Labs  ?Lab 12/14/21 ?1254 12/16/21 ?[id number]  ?AST 32 24  ?ALT 23 21  ?ALKPHOS 223* 181*  ?BILITOT 1.6* 1.2  ?PROT 7.0 6.2*  ?ALBUMIN 3.5 2.9*  ? ?No results for input(s): LIPASE, AMYLASE in the last 168 hours. ?No results for input(s): AMMONIA in the last 168 hours. ?Coagulation Profile: ?No results for input(s): INR, PROTIME in the last 168 hours. ?Cardiac Enzymes: ?No results for input(s): CKTOTAL, CKMB, CKMBINDEX, TROPONINI in the last 168 hours. ?BNP (last 3 results) ?No results for input(s): PROBNP in the last 8760 hours. ?HbA1C: ?Recent Labs  ?  12/15/21 ?0411  ?HGBA1C 6.5*  ? ?CBG: ?Recent Labs  ?Lab 12/16/21 ?1147 12/16/21 ?1605 12/16/21 ?2107 12/17/21 ?[id number] 12/17/21 ?1126  ?GLUCAP 101* 114* 137* 86 98  ? ?Lipid Profile: ?Recent Labs  ?  12/15/21 ?1851 12/15/21 ?1855  ?CHOL  --  84  ?HDL  --  26*  ?[place]  --  51  ?TRIG  --  35  ?CHOLHDL  --  3.2  ?LDLDIRECT 50.1  --   ? ?Thyroid Function Tests: ?Recent Labs  ?  12/14/21 ?1425 12/15/21 ?0411  ?TSH <0.010*  --   ?FREET4  --  3.68*  ?T3FREE  --  14.8*  ? ?Anemia Panel: ?No results for input(s): VITAMINB12, FOLATE, FERRITIN, TIBC, IRON, RETICCTPCT in the last 72 hours. ?Urine analysis: ?   ?Component Value Date/Time  ? [place] 12/14/2021 1254  ? APPEARANCEUR CLEAR 12/14/2021 1254  ? LABSPEC 1.025 12/14/2021 1254  ? PHURINE 7.0 12/14/2021 1254  ? GLUCOSEU 250 (A) 12/14/2021 1254  ? HGBUR NEGATIVE 12/14/2021 1254  ? BILIRUBINUR NEGATIVE 12/14/2021 1254  ?  [place] NEGATIVE 12/14/2021 1254  ? PROTEINUR NEGATIVE 12/14/2021 1254  ? NITRITE NEGATIVE 12/14/2021 1254  ? LEUKOCYTESUR NEGATIVE 12/14/2021 1254  ? ?Sepsis Labs: ?@LABRCNTIP (procalcitonin:4,lacticidven:4) ? ?)No results found for this or any previous visit (from the past 240 hour(s)).  ? ?Radiology Studies: ?ECHOCARDIOGRAM COMPLETE ? ?Result Date: 12/15/2021 ?   ECHOCARDIOGRAM REPORT   Patient Name:   [facility] [place] Date of Exam: 12/15/2021 Medical Rec #:  [id number]        Height:       64.0 in Accession #:    [id number]       Weight:       116.1 lb Date of Birth:  [birth year]        BSA:  1.553 m? Patient Age:    81 years         BP:           102/65 mmHg Patient Gender: M                HR:           95 bpm. Exam Location:  Inpatient Procedure: 2D Echo Indications:    acute diastolic chf  History:        Patient has no prior history of Echocardiogram examinations.                 CHF, Arrythmias:Atrial Fibrillation; Risk Factors:Diabetes.  Sonographer:    Johny Chess RDCS Referring Phys: [id number] [place]  1. Left ventricular ejection fraction, by estimation, is 60 to 65%. The left ventricle has normal function. The left ventricle has no regional wall motion abnormalities. Diastolic function indeterminant due to AFib.  2. Right ventricular systolic function is mildly reduced. The right ventricular size is mildly enlarged. There is moderately elevated pulmonary artery systolic pressure. The estimated right ventricular systolic pressure is Q000111Q mmHg.  3. Left atrial size was moderately dilated.  4. Right atrial size was moderately dilated.  5. The mitral valve is grossly normal. There is moderate-to-severe functional mitral regurgitation.  6. Tricuspid valve regurgitation is moderate to severe. Appears to be functional with poor central coaptation of the TV leaflets.  7. The aortic valve is tricuspid. There is mild calcification of the aortic valve. There is mild thickening of the  aortic valve. Aortic valve regurgitation is trivial. Aortic valve sclerosis/calcification is present, without any evidence of aortic stenosis.  8. The inferior vena cava is dilated in size with <50% r

## 2021-12-17 NOTE — Anesthesia Preprocedure Evaluation (Addendum)
Anesthesia Evaluation  ?Patient identified by MRN, date of birth, ID band ?Patient awake ? ? ? ?Reviewed: ?Allergy & Precautions, NPO status , Patient's Chart, lab work & pertinent test results ? ?Airway ?Mallampati: III ? ?TM Distance: >3 FB ?Neck ROM: Full ? ? ? Dental ? ?(+) Dental Advisory Given, Poor Dentition, Chipped, Missing ?  ?Pulmonary ?former smoker,  ?  ?Pulmonary exam normal ?breath sounds clear to auscultation ? ? ? ? ? ? Cardiovascular ?+CHF  ?+ dysrhythmias Atrial Fibrillation  ?Rhythm:Irregular Rate:Abnormal ? ? ?  ?Neuro/Psych ?negative neurological ROS ?   ? GI/Hepatic ?Neg liver ROS, GERD  ,  ?Endo/Other  ?diabetes, Type 2Hyperthyroidism  ? Renal/GU ?negative Renal ROS  ? ?  ?Musculoskeletal ? ?(+) Arthritis ,  ? Abdominal ?  ?Peds ? Hematology ? ?(+) Blood dyscrasia (Thrombocytopenia), ,   ?Anesthesia Other Findings ?Day of surgery medications reviewed with the patient. ? Reproductive/Obstetrics ? ?  ? ? ? ? ? ? ? ? ? ? ? ? ? ?  ?  ? ? ? ? ? ? ? ?Anesthesia Physical ?Anesthesia Plan ? ?ASA: 3 ? ?Anesthesia Plan: General  ? ?Post-op Pain Management: Minimal or no pain anticipated  ? ?Induction: Intravenous ? ?PONV Risk Score and Plan: 2 and TIVA ? ?Airway Management Planned: Natural Airway and Nasal Cannula ? ?Additional Equipment:  ? ?Intra-op Plan:  ? ?Post-operative Plan:  ? ?Informed Consent: I have reviewed the patients History and Physical, chart, labs and discussed the procedure including the risks, benefits and alternatives for the proposed anesthesia with the patient or authorized representative who has indicated his/her understanding and acceptance.  ? ? ? ?Dental advisory given and Interpreter used for interveiw ? ?Plan Discussed with: CRNA ? ?Anesthesia Plan Comments:   ? ? ? ? ?Anesthesia Quick Evaluation ? ?

## 2021-12-17 NOTE — Progress Notes (Signed)
Pt walked in hallway several laps up and down. No issues noted with heart rate A.Fib rhythm remained 70's-80's during walk. Pt also has on complaints of pain or discomfort.  ?

## 2021-12-17 NOTE — Transfer of Care (Signed)
Immediate Anesthesia Transfer of Care Note ? ?Patient: Gary Ryan ? ?Procedure(s) Performed: TRANSESOPHAGEAL ECHOCARDIOGRAM (TEE) ?CARDIOVERSION ?BUBBLE STUDY ? ?Patient Location: Short Stay ? ?Anesthesia Type:General ? ?Level of Consciousness: drowsy ? ?Airway & Oxygen Therapy: Patient Spontanous Breathing ? ?Post-op Assessment: Report given to RN and Post -op Vital signs reviewed and stable ? ?Post vital signs: Reviewed and stable ? ?Last Vitals:  ?Vitals Value Taken Time  ?BP 99/48 12/17/21 1316  ?Temp    ?Pulse 52 12/17/21 1317  ?Resp 23 12/17/21 1317  ?SpO2 98 % 12/17/21 1317  ?Vitals shown include unvalidated device data. ? ?Last Pain:  ?Vitals:  ? 12/17/21 1200  ?TempSrc: Temporal  ?PainSc: 0-No pain  ?   ? ?  ? ?Complications: No notable events documented. ?

## 2021-12-17 NOTE — Anesthesia Postprocedure Evaluation (Signed)
Anesthesia Post Note ? ?Patient: Gary Ryan ? ?Procedure(s) Performed: TRANSESOPHAGEAL ECHOCARDIOGRAM (TEE) ?CARDIOVERSION ?BUBBLE STUDY ? ?  ? ?Patient location during evaluation: Endoscopy ?Anesthesia Type: General ?Level of consciousness: awake and alert ?Pain management: pain level controlled ?Vital Signs Assessment: post-procedure vital signs reviewed and stable ?Respiratory status: spontaneous breathing, nonlabored ventilation, respiratory function stable and patient connected to nasal cannula oxygen ?Cardiovascular status: blood pressure returned to baseline and stable ?Postop Assessment: no apparent nausea or vomiting ?Anesthetic complications: no ? ? ?No notable events documented. ? ?Last Vitals:  ?Vitals:  ? 12/17/21 1340 12/17/21 1405  ?BP: (!) 103/44 (!) 105/57  ?Pulse: (!) 56 (!) 55  ?Resp: 20 18  ?Temp:  36.4 ?C  ?SpO2: 96% 96%  ?  ?Last Pain:  ?Vitals:  ? 12/17/21 1405  ?TempSrc: Oral  ?PainSc: 0-No pain  ? ? ?  ?  ?  ?  ?  ?  ? ?Collene Schlichter ? ? ? ? ?

## 2021-12-18 DIAGNOSIS — I5031 Acute diastolic (congestive) heart failure: Secondary | ICD-10-CM

## 2021-12-18 LAB — CBC
HCT: 41 % (ref 39.0–52.0)
Hemoglobin: 14.2 g/dL (ref 13.0–17.0)
MCH: 29.7 pg (ref 26.0–34.0)
MCHC: 34.6 g/dL (ref 30.0–36.0)
MCV: 85.8 fL (ref 80.0–100.0)
Platelets: 164 10*3/uL (ref 150–400)
RBC: 4.78 MIL/uL (ref 4.22–5.81)
RDW: 13.7 % (ref 11.5–15.5)
WBC: 6.8 10*3/uL (ref 4.0–10.5)
nRBC: 0 % (ref 0.0–0.2)

## 2021-12-18 LAB — BRAIN NATRIURETIC PEPTIDE: B Natriuretic Peptide: 250.9 pg/mL — ABNORMAL HIGH (ref 0.0–100.0)

## 2021-12-18 LAB — BASIC METABOLIC PANEL
Anion gap: 9 (ref 5–15)
BUN: 30 mg/dL — ABNORMAL HIGH (ref 8–23)
CO2: 25 mmol/L (ref 22–32)
Calcium: 8.8 mg/dL — ABNORMAL LOW (ref 8.9–10.3)
Chloride: 101 mmol/L (ref 98–111)
Creatinine, Ser: 0.61 mg/dL (ref 0.61–1.24)
GFR, Estimated: >60 mL/min (ref >60)
Glucose, Bld: 106 mg/dL — ABNORMAL HIGH (ref 70–99)
Potassium: 3.7 mmol/L (ref 3.5–5.1)
Sodium: 135 mmol/L (ref 135–145)

## 2021-12-18 LAB — GLUCOSE, CAPILLARY
Glucose-Capillary: 196 mg/dL — ABNORMAL HIGH (ref 70–99)
Glucose-Capillary: 216 mg/dL — ABNORMAL HIGH (ref 70–99)
Glucose-Capillary: 105 mg/dL — ABNORMAL HIGH (ref 70–99)

## 2021-12-18 MED ORDER — METFORMIN HCL 500 MG PO TABS: 500.0000 mg | ORAL_TABLET | Freq: Every day | ORAL | 0 refills | Status: DC

## 2021-12-18 MED ORDER — DIGOXIN 125 MCG PO TABS: 0.1250 mg | ORAL_TABLET | Freq: Every day | ORAL | 0 refills | Status: DC

## 2021-12-18 MED ORDER — APIXABAN 5 MG PO TABS: 5.0000 mg | ORAL_TABLET | Freq: Two times a day (BID) | ORAL | 0 refills | Status: DC

## 2021-12-18 MED ORDER — FUROSEMIDE 20 MG PO TABS: 20.0000 mg | ORAL_TABLET | Freq: Every day | ORAL | 0 refills | Status: DC

## 2021-12-18 MED ORDER — METHIMAZOLE 5 MG PO TABS
5.0000 mg | ORAL_TABLET | Freq: Two times a day (BID) | ORAL | 0 refills | Status: DC
Start: 2021-12-18 — End: 2022-06-03

## 2021-12-18 MED ORDER — POTASSIUM CHLORIDE CRYS ER 20 MEQ PO TBCR
40.0000 meq | EXTENDED_RELEASE_TABLET | Freq: Once | ORAL | Status: AC
  Administered 2021-12-18: 40 meq via ORAL
  Filled 2021-12-18: qty 2

## 2021-12-18 MED ORDER — FUROSEMIDE 20 MG PO TABS
20.0000 mg | ORAL_TABLET | Freq: Every day | ORAL | Status: DC
Start: 2021-12-18 — End: 2021-12-18
  Administered 2021-12-18: 20 mg via ORAL
  Filled 2021-12-18: qty 1

## 2021-12-18 MED ORDER — ATENOLOL 25 MG PO TABS: 25.0000 mg | ORAL_TABLET | Freq: Two times a day (BID) | ORAL | 0 refills | Status: DC

## 2021-12-18 MED ORDER — SILVER SULFADIAZINE 1 % EX CREA
TOPICAL_CREAM | Freq: Two times a day (BID) | CUTANEOUS | Status: DC
Start: 2021-12-18 — End: 2021-12-18
  Filled 2021-12-18: qty 85

## 2021-12-18 MED ORDER — DAPAGLIFLOZIN PROPANEDIOL 10 MG PO TABS: 10.0000 mg | ORAL_TABLET | Freq: Every day | ORAL | 0 refills | Status: DC

## 2021-12-18 NOTE — Progress Notes (Signed)
Progress Note ? ?Patient Name: Gary Ryan ?Date of Encounter: 12/18/2021 ? ?Attending physician: Domenic Polite, MD ?Primary care provider: Pcp, No ? ?Subjective: ?Gary Ryan Persons is a 66 y.o. male who was seen and examined at bedside  ?Denies chest pain or shortness of breath. ?Remains in sinus rhythm. ?Son and wife at bedside. ? ?Objective: ?Vital Signs in the last 24 hours: ?Temp:  [97.6 ?F (36.4 ?C)-98 ?F (36.7 ?C)] 98 ?F (36.7 ?C) (05/13 0407) ?Pulse Rate:  [55-70] 62 (05/13 0853) ?Resp:  [8-23] 18 (05/12 1714) ?BP: (93-108)/(44-59) 103/59 (05/13 0853) ?SpO2:  [96 %-97 %] 96 % (05/12 1931) ?Weight:  [45.5 kg] 45.5 kg (05/13 0407) ? ?Intake/Output: ? ?Intake/Output Summary (Last 24 hours) at 12/18/2021 1203 ?Last data filed at 12/17/2021 1417 ?Gross per 24 hour  ?Intake 420 ml  ?Output --  ?Net 420 ml  ?  ?Net IO Since Admission: -2,447.13 mL [12/18/21 1203] ? ?Weights:  ?Filed Weights  ? 12/16/21 0330 12/17/21 1200 12/18/21 0407  ?Weight: 49.1 kg 49.1 kg 45.5 kg  ? ? ?Telemetry: Personally reviewed.  Sinus bradycardia without dysrhythmia. ? ?Physical examination: ?PHYSICAL EXAM: ? ?  12/18/2021  ?  8:53 AM 12/18/2021  ?  4:07 AM 12/17/2021  ?  7:31 PM  ?Vitals with BMI  ?Weight  100 lbs 5 oz   ?BMI  17.21   ?Systolic [id number]  ?Diastolic 59 55 55  ?Pulse 62 70 66  ? ? ?General: Age-appropriate, predominately speaks [place], resting in bed comfortably, able to lay supine.  Hemodynamically stable, no acute distress ?HEENT: Normocephalic, atraumatic, positive JVP, moist oral membranes, mild thyromegaly noted ?Lungs: Clear to auscultation bilaterally no wheezes rales or rhonchi's. ?Chest: Superficial anterior and posterior skin burn (secondary to cardioversion) ?Heart: Bradycardic, positive S1-S2, no murmurs, gallops or rubs. ?Abdomen: Soft, nontender, nondistended, positive bowel sounds in all 4 quadrants, ?Extremities: No pitting edema, warm to touch, 2+ DP and PT pulses. ?Neuro: Oriented to person  place and time.  Nonfocal, normal muscle tone and range of motion ?Psych: Pleasant, cooperative, normal mood and affect. ? ?Lab Results: ?Hematology ?Recent Labs  ?Lab 12/16/21 ?[id number] 12/17/21 ?0259 12/18/21 ?[id number]  ?WBC 6.5 7.4 6.8  ?RBC 4.21* 4.64 4.78  ?HGB 12.5* 13.5 14.2  ?HCT 37.0* 41.0 41.0  ?MCV 87.9 88.4 85.8  ?MCH 29.7 29.1 29.7  ?MCHC 33.8 32.9 34.6  ?RDW 14.7 14.1 13.7  ?PLT 121* 149* 164  ? ? ?Chemistry ?Recent Labs  ?Lab 12/14/21 ?1254 12/15/21 ?0411 12/16/21 ?[id number] 12/17/21 ?0259 12/18/21 ?[id number]  ?NA 138   < > 140 135 135  ?K 3.9   < > 4.3 4.1 3.7  ?CL 109   < > 113* 105 101  ?CO2 23   < > 21* 19* 25  ?GLUCOSE 162*   < > 111* 88 106*  ?BUN 12   < > 14 22 30*  ?CREATININE 0.43*   < > 0.64 0.73 0.61  ?CALCIUM 8.6*   < > 8.7* 8.7* 8.8*  ?PROT 7.0  --  6.2*  --   --   ?ALBUMIN 3.5  --  2.9*  --   --   ?AST 32  --  24  --   --   ?ALT 23  --  21  --   --   ?ALKPHOS 223*  --  181*  --   --   ?BILITOT 1.6*  --  1.2  --   --   ?GFRNONAA >60   < > >  60 >60 >60  ?ANIONGAP 6   < > 6 11 9   ? < > = values in this interval not displayed.  ?  ? ?Cardiac Enzymes: ?Cardiac Panel (last 3 results) ?No results for input(s): CKTOTAL, CKMB, TROPONINIHS, RELINDX in the last 72 hours. ? ? ?BNP (last 3 results) ?Recent Labs  ?  12/14/21 ?1254  ?BNP 476.6*  ? ? ?ProBNP (last 3 results) ?No results for input(s): PROBNP in the last 8760 hours. ? ? ?DDimer No results for input(s): DDIMER in the last 168 hours.  ? ?Hemoglobin A1c:  ?Lab Results  ?Component Value Date  ? HGBA1C 6.5 (H) 12/15/2021  ? MPG 139.85 12/15/2021  ? ? ?TSH  ?Recent Labs  ?  12/14/21 ?1425  ?TSH <0.010*  ? ? ?Lipid Panel  ?   ?Component Value Date/Time  ? CHOL 84 12/15/2021 1855  ? TRIG 35 12/15/2021 1855  ? HDL 26 (L) 12/15/2021 1855  ? CHOLHDL 3.2 12/15/2021 1855  ? VLDL 7 12/15/2021 1855  ? [place] 51 12/15/2021 1855  ? LDLDIRECT 50.1 12/15/2021 1851  ? ? ?Imaging: ?No results found. ? ?CARDIAC DATABASE: ?EKG: ?12/14/2021: Atrial fibrillation, 117 bpm, prolonged  QT, without underlying injury pattern. ? ?12/17/2021: Sinus bradycardia, 53 bpm, normal axis, poor R wave progression, ST-T changes consider anterior ischemia, without underlying injury pattern ?  ?Echocardiogram: ?12/15/2021: ? 1. Left ventricular ejection fraction, by estimation, is 60 to 65%. The left ventricle has normal function. The left ventricle has no regional  ?wall motion abnormalities. Diastolic function indeterminant due to AFib.   ? 2. Right ventricular systolic function is mildly reduced. The right  ventricular size is mildly enlarged. There is moderately elevated pulmonary artery systolic pressure. The estimated right ventricular systolic pressure is Q000111Q mmHg.  ? 3. Left atrial size was moderately dilated.  ? 4. Right atrial size was moderately dilated.  ? 5. The mitral valve is grossly normal. There is moderate-to-severe  functional mitral regurgitation.  ? 6. Tricuspid valve regurgitation is moderate to severe. Appears to be functional with poor central coaptation of the TV leaflets.  ? 7. The aortic valve is tricuspid. There is mild calcification of the aortic valve. There is mild thickening of the aortic valve. Aortic valve regurgitation is trivial. Aortic valve sclerosis/calcification is present, without any evidence of aortic stenosis.  ? 8. The inferior vena cava is dilated in size with <50% respiratory variability, suggesting right atrial pressure of 15 mmHg. ? ?12/17/2021 transesophageal echocardiogram: Results forthcoming ? ?Scheduled Meds: ? apixaban  5 mg Oral BID  ? atenolol  25 mg Oral BID  ? dapagliflozin propanediol  10 mg Oral Daily  ? digoxin  0.125 mg Oral Daily  ? furosemide  20 mg Oral Daily  ? insulin aspart  0-6 Units Subcutaneous TID WC  ? methimazole  5 mg Oral BID  ? potassium chloride  40 mEq Oral Once  ? ?Continuous Infusions: ? ? ? ?PRN Meds: ?acetaminophen, metoprolol tartrate  ? ?IMPRESSION & RECOMMENDATIONS: ?Gary Ryan is a 66 y.o. [place] male whose past  medical history and cardiac risk factors include: Diabetes mellitus type 2, former smoker, atrial fibrillation, HFpEF, hyperthyroidism, pulmonary hypertension per echocardiogram, moderate/severe MR and TR. ? ?Impression: ?Paroxysmal atrial fibrillation, status post TEE guided cardioversion ?Compensated HFpEF stage C, NYHA class II ?Hyperthyroidism ?Prolonged QT interval-improved ?Non-insulin-dependent diabetes mellitus type 2 ?Language barrier ? ?Plan: ?Paroxysmal atrial fibrillation ?Rate control: Atenolol ?Rhythm control: N/A ?Thromboembolic prophylaxis: Eliquis ?CHA2DS2-VASc SCORE is 2 which correlates  to 2.2 % risk of stroke per year (CHF, diabetes). ?Moderately dilated left atrium. ?AVOID amiodarone for rate/rhythm control given his hyperthyroid state. ?Continue digoxin 125 mcg p.o. daily. ?Underwent TEE guided direct-current cardioversion 12/17/2021; 200 J x 3 required to obtain sinus rhythm ?Patient, wife, and son are educated on the importance of uninterrupted anticoagulation for at least 4 weeks. ? ?Acute heart failure with preserved EF: ?Compensated ?Likely due to A-fib with RVR, tachycardia induced cardiomyopathy. ?Stage C, NYHA class II ?Net IO Since Admission: -2,447.13 mL [12/18/21 1203]. ?I suspect that the I's and O's are not accurately documented as the weight on admission was 54.4 kg and today it is 45.5 kg this discrepancy could have also resulted from urine collections for myeloma work-up. ?Agree with transition from IV Lasix to p.o. ?Continue Farxiga 10 mg p.o. daily. ?Continue BB and methimazole for thyroid function.  ?Continue Dig for rate control.  ?Unable to uptitrate GDMT due to soft BP - will do it as outpatient and once thyroid function improves.  ?Results of 24-hour urine collection for UPEP and myeloma work-up ?Recommend daily weight check, strict I/O's ?Fluid restriction to <2L per day, Na restriction < 2g per day ? ?Abnormal bone density: ?CTA performed in the ED notes nonspecific  diffusely heterogenous bone density. ?Had strain images done after his TEE on 12/17/2021 and its not suggestive of infiltrative process. ?Undergoing myeloma work-up. ?Further work-up per primary team. ? ?Hyperthyr

## 2021-12-18 NOTE — Progress Notes (Signed)
?PROGRESS NOTE ? ? ? ?[name]  YOV:[id number] DOB: [birth year] DOA: 12/14/2021 ?PCP: Pcp, No  ?65/M with history of type 2 diabetes mellitus, chronic back pain, GERD presented to the ED with fatigue, lightheadedness and intermittent's speech difficulty for a few weeks, wife also noticed leg swelling and facial swelling. ?-In the ED he was noted to be in A-fib RVR, CTA head and neck noted enlarged thyroid, heterogenous bone density, labs notable for platelet of 112K and BNP of 477 ?-Improving on diuretics, atenolol ?-Echo with preserved EF, mild PAH ?-5/12 underwent TEE, cardioversion ? ?Subjective: ?-Feels well, denies any complaints, no dyspnea with activity ? ?Assessment and Plan: ? ?Atrial fibrillation with RVR  ?- Presents with 4 wks of lightheadedness and intermittent swelling and found to be in new-onset atrial fibrillation with RVR ?- CHADS-VASc may be only 1 (DM)  ?- TSH is <0.01 and free T4 elevated at 3.6, hyperthyroidism could be triggering this ?- Started on atenolol and methimazole ?-Appreciate cardiology input, off Cardizem now, underwent TEE cardioversion yesterday, back in sinus rhythm ?-Continue apixaban ?-Home today if okay with cardiology ? ?Acute diastolic CHF ?Moderate PAH ?-Bilateral pleural effusions, edema, dyspnea on exertion ?-Possibly triggered by A-fib RVR ?-Echo with preserved EF, moderate PAH ?-Improving, he is 2.5 L negative, clinically appears euvolemic, start p.o. Lasix today  ?-Continue Farxiga at discharge ?  ?Hyperthyroidism; enlarged thyroid   ?- TSH is <0.01 in ED, thyroid enlarged and heterogeneous on CT  ?-Free T4 and T3 are elevated, continue atenolol ?-Started on methimazole, recommended endocrinology follow-up and thyroid ultrasound,  ?  ?Thrombocytopenia  ?- Platelets 112k on admission mild, improved ?  ?Abnormal bone density  ?- Diffusely heterogenous bone density noted incidentally on CT in ED  ?- Calcium slightly low, creatinine normal  ?-Follow-up myeloma panel,  kappa free light chains mildly elevated, ratio was normal, ?-Hematology follow-up needed ?  ?Type II DM  ?- A1c was 6.6% in October 2022  ?-Diet controlled, continue sliding scale insulin ?-Add metformin at discharge ?  ?Prolonged QT interval  ?- QTc is 515 ms in ED  ?- Keep potassium 4 and mag 2, avoid QT-prolonging medications   ?  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  ?Dysarthria  ?- Patient reports 2 wks of intermittent dysarthria, most recently yesterday evening and ?- No acute intracranial abnormality noted on head CT, and no focal deficit identified on exam  ?-MRI brain unremarkable ?-?  TIA in the setting of A-fib, now resolved, monitor ?  ?  ?DVT prophylaxis: Eliquis  ?Code Status: Full  ?Family Communication: Son and daughter at bedside ?Disposition Plan: Home likely today ? ?Consultants:  ?Cardiology ? ?Procedures: TEE/DCCV 5/12 ? ?Antimicrobials:  ? ? ?Objective: ?Vitals:  ? 12/17/21 1714 12/17/21 1931 12/18/21 0407 12/18/21 0853  ?BP: (!) 108/54 (!) 106/55 (!) 104/55 (!) 103/59  ?Pulse: 63 66 70 62  ?Resp: 18     ?Temp: 97.9 ?F (36.6 ?C) 97.6 ?F (36.4 ?C) 98 ?F (36.7 ?C)   ?TempSrc: Oral Oral Oral   ?SpO2:  96%    ?Weight:   45.5 kg   ?Height:      ? ? ?Intake/Output Summary (Last 24 hours) at 12/18/2021 1158 ?Last data filed at 12/17/2021 1417 ?Gross per 24 hour  ?Intake 420 ml  ?Output --  ?Net 420 ml  ? ?Filed Weights  ? 12/16/21 0330 12/17/21 1200 12/18/21 0407  ?Weight:  49.1 kg 49.1 kg 45.5 kg  ? ? ?Examination: ? ?General exam: Pleasant thinly built male laying in bed, AAOx3, no distress ?CVS: S1-S2, regular rate and rhythm ?Lungs: Clear bilaterally ?Abdomen: Soft, nontender, bowel sounds present ?Extremities: No edema  ?Skin: No rashes ?Psychiatry:  Mood & affect appropriate.  ? ? ? ?Data Reviewed:  ? ?CBC: ?Recent Labs  ?Lab 12/14/21 ?1254 12/15/21 ?0411 12/16/21 ?1478 12/17/21 ?0259 12/18/21 ?2956  ?WBC 5.5 6.0 6.5 7.4 6.8   ?HGB 12.7* 13.1 12.5* 13.5 14.2  ?HCT 37.6* 38.7* 37.0* 41.0 41.0  ?MCV 87.6 87.8 87.9 88.4 85.8  ?PLT 112* 121* 121* 149* 164  ? ?Basic Metabolic Panel: ?Recent Labs  ?Lab 12/14/21 ?1254 12/15/21 ?0411 12/16/21 ?2130 12/17/21 ?0259 12/18/21 ?8657  ?NA 138 135 140 135 135  ?K 3.9 4.3 4.3 4.1 3.7  ?CL 109 110 113* 105 101  ?CO2 23 22 21* 19* 25  ?GLUCOSE 162* 155* 111* 88 106*  ?BUN 12 11 14 22  30*  ?CREATININE 0.43* 0.44* 0.64 0.73 0.61  ?CALCIUM 8.6* 8.9 8.7* 8.7* 8.8*  ?MG 1.8 2.0  --   --   --   ? ?GFR: ?Estimated Creatinine Clearance: 60 mL/min (by C-G formula based on SCr of 0.61 mg/dL). ?Liver Function Tests: ?Recent Labs  ?Lab 12/14/21 ?1254 12/16/21 ?02/15/22  ?AST 32 24  ?ALT 23 21  ?ALKPHOS 223* 181*  ?BILITOT 1.6* 1.2  ?PROT 7.0 6.2*  ?ALBUMIN 3.5 2.9*  ? ?No results for input(s): LIPASE, AMYLASE in the last 168 hours. ?No results for input(s): AMMONIA in the last 168 hours. ?Coagulation Profile: ?No results for input(s): INR, PROTIME in the last 168 hours. ?Cardiac Enzymes: ?No results for input(s): CKTOTAL, CKMB, CKMBINDEX, TROPONINI in the last 168 hours. ?BNP (last 3 results) ?No results for input(s): PROBNP in the last 8760 hours. ?HbA1C: ?No results for input(s): HGBA1C in the last 72 hours. ? ?CBG: ?Recent Labs  ?Lab 12/17/21 ?1126 12/17/21 ?1717 12/17/21 ?2126 12/18/21 ?12/20/21 12/18/21 ?0802  ?GLUCAP 98 267* 237* 196* 216*  ? ?Lipid Profile: ?Recent Labs  ?  12/15/21 ?1851 12/15/21 ?1855  ?CHOL  --  84  ?HDL  --  26*  ?LDLCALC  --  51  ?TRIG  --  35  ?CHOLHDL  --  3.2  ?LDLDIRECT 50.1  --   ? ?Thyroid Function Tests: ?No results for input(s): TSH, T4TOTAL, FREET4, T3FREE, THYROIDAB in the last 72 hours. ? ?Anemia Panel: ?No results for input(s): VITAMINB12, FOLATE, FERRITIN, TIBC, IRON, RETICCTPCT in the last 72 hours. ?Urine analysis: ?   ?Component Value Date/Time  ? COLORURINE YELLOW 12/14/2021 1254  ? APPEARANCEUR CLEAR 12/14/2021 1254  ? LABSPEC 1.025 12/14/2021 1254  ? PHURINE 7.0 12/14/2021 1254   ? GLUCOSEU 250 (A) 12/14/2021 1254  ? HGBUR NEGATIVE 12/14/2021 1254  ? BILIRUBINUR NEGATIVE 12/14/2021 1254  ? KETONESUR NEGATIVE 12/14/2021 1254  ? PROTEINUR NEGATIVE 12/14/2021 1254  ? NITRITE NEGATIVE 12/14/2021 1254  ? LEUKOCYTESUR NEGATIVE 12/14/2021 1254  ? ?Sepsis Labs: ?@[email or web address] (procalcitonin:4,lacticidven:4) ? ?)No results found for this or any previous visit (from the past 240 hour(s)).  ? ?Radiology Studies: ?No results found. ? ? ?Scheduled Meds: ? apixaban  5 mg Oral BID  ? atenolol  25 mg Oral BID  ? dapagliflozin propanediol  10 mg Oral Daily  ? digoxin  0.125 mg Oral Daily  ? insulin aspart  0-6 Units Subcutaneous TID WC  ? methimazole  5 mg Oral BID  ? ?Continuous Infusions: ? ? ? ?  LOS: 2 days  ? ? ?Time spent: ? ?Zannie Cove, MD ?[facility] ? ? ?12/18/2021, 11:58 AM  ?  ?

## 2021-12-18 NOTE — TOC Initial Note (Signed)
Transition of Care (TOC) - Initial/Assessment Note  ? ? ?Patient Details  ?Name: Gary Ryan ?MRN: [id number] ?Date of Birth: September 10, 1955 ? ?Transition of Care (TOC) CM/SW Contact:    ?Janie Strothman G., RN ?Phone Number: ?12/18/2021, 4:09 PM ? ?Clinical Narrative:  ?               Gary Ryan is a pleasant 66 y.o. male with medical history significant for type 2 diabetes mellitus, GERD, and chronic back pain, now presenting to the emergency department with lightheadedness, fatigue, and speech difficulty.  Patient reports developing lightheadedness and fatigue 4 weeks ago which has worsened significantly over the past 2 weeks.  He has also had intermittent dysarthria over the past 2 weeks, as well as intermittent bilateral leg swelling and sensation of facial swelling.  He denies chest pain or palpitations, denies cough or shortness of breath, denies sore throat or difficulty swallowing, and denies fever or chills. ? ?RNCM received request for Eliquis and Farxiga medication cards.  Medication cards with instruction provided to patient's son at bedside.  All questions answered. ? ?Expected Discharge Plan: Home/Self Care ?Barriers to Discharge: No Barriers Identified ? ?Patient Goals and CMS Choice ?Patient states their goals for this hospitalization and ongoing recovery are:: return home ?  ?Choice offered to / list presented to : NA ? ?Expected Discharge Plan and Services ?Expected Discharge Plan: Home/Self Care ?  ?Discharge Planning Services: CM Consult, Medication Assistance ?Post Acute Care Choice: NA ?Living arrangements for the past 2 months: Apartment ?Expected Discharge Date: 12/18/21               ?DME Arranged: N/A ?DME Agency: NA ?  ?  ?  ?HH Arranged: NA ?HH Agency: NA ?  ?  ?  ?Prior Living Arrangements/Services ?Living arrangements for the past 2 months: Apartment ?Lives with:: Spouse ?Patient language and need for interpreter reviewed:: Yes ?Do you feel safe going back to the place where you live?:  Yes      ?Need for Family Participation in Patient Care: Yes (Comment) ?Care giver support system in place?: Yes (comment) ?  ?Criminal Activity/Legal Involvement Pertinent to Current Situation/Hospitalization: No - Comment as needed ? ?Activities of Daily Living ?Home Assistive Devices/Equipment: None ?ADL Screening (condition at time of admission) ?Patient's cognitive ability adequate to safely complete daily activities?: No ?Is the patient deaf or have difficulty hearing?: No ?Does the patient have difficulty seeing, even when wearing glasses/contacts?: No ?Does the patient have difficulty concentrating, remembering, or making decisions?: No ?Patient able to express need for assistance with ADLs?: No ?Does the patient have difficulty dressing or bathing?: No ?Independently performs ADLs?: Yes (appropriate for developmental age) ?Does the patient have difficulty walking or climbing stairs?: No ?Weakness of Legs: None ?Weakness of Arms/Hands: None ? ?Permission Sought/Granted ?  ?  ?     ?   ?Emotional Assessment ?Appearance:: Appears stated age ?Attitude/Demeanor/Rapport: Engaged ?Affect (typically observed): Accepting ?Orientation: : Oriented to Self, Oriented to Place, Oriented to  Time, Oriented to Situation ?  ?Psych Involvement: No (comment) ? ?Admission diagnosis:  Atrial fibrillation with RVR (HCC) [I48.91] ?Atrial fibrillation, unspecified type (HCC) [I48.91] ?Patient Active Problem List  ? Diagnosis Date Noted  ? Acute diastolic CHF (congestive heart failure) (HCC) 12/18/2021  ? Atrial fibrillation with RVR (HCC) 12/14/2021  ? Acute CHF (congestive heart failure) (HCC) 12/14/2021  ? Type II diabetes mellitus (HCC) 12/14/2021  ? Thrombocytopenia (HCC) 12/14/2021  ? Enlarged thyroid 12/14/2021  ? Bone disease  12/14/2021  ? Pleural effusion due to CHF (congestive heart failure) (HCC) 12/14/2021  ? Hyperthyroidism 12/14/2021  ? Prolonged QT interval 12/14/2021  ? ?PCP:  Pcp, No ?Pharmacy:   ?[id number] - [place], [place] - [address] AT [facility] OF [address] ?[address] ?[place] [address] ?Phone: [phone] Fax: [phone] ? ?[facility] ?[address] ?[place] [address] ?Phone: [phone] Fax: [phone] ? ?Social Determinants of Health (SDOH) Interventions ?  ?Readmission Risk Interventions ?   ? View : No data to display.  ?  ?  ?  ? ?

## 2021-12-18 NOTE — Discharge Summary (Signed)
Physician Discharge Summary  ?Gary Ryan [id number] DOB: 1956-02-16 DOA: 12/14/2021 ? ?PCP: Pcp, No ? ?Admit date: 12/14/2021 ?Discharge date: 12/18/2021 ? ?Time spent: 35 minutes ? ?Recommendations for Outpatient Follow-up:  ?Cardiology Dr. Terri Skains in 2 weeks, ?Endocrinology follow-up for hyperthyroidism and heterogenous thyroid gland on imaging ?PCP in 1 week, please check BMP at follow-up, follow-up myeloma panel ?Needs hematology referral, had heterogenous bone density and thrombocytopenia on admission ? ? ?Discharge Diagnoses:  ?Principal Problem: ?  Atrial fibrillation with RVR ([place]) ?Active Problems: ?  Acute CHF (congestive heart failure) ([place]) ?  Type II diabetes mellitus ([place]) ?  Thrombocytopenia ([place]) ?  Enlarged thyroid ?  Bone disease ?  Pleural effusion due to CHF (congestive heart failure) ([place]) ?  Hyperthyroidism ?  Prolonged QT interval ?  Acute diastolic CHF (congestive heart failure) ([place]) ? ? ?Discharge Condition: Stable ? ?Diet recommendation: Low-sodium, heart healthy ? ?Filed Weights  ? 12/16/21 0330 12/17/21 1200 12/18/21 0407  ?Weight: 49.1 kg 49.1 kg 45.5 kg  ? ? ?History of present illness:  ?65/M with history of type 2 diabetes mellitus, chronic back pain, GERD presented to the ED with fatigue, lightheadedness and intermittent's speech difficulty for a few weeks, wife also noticed leg swelling and facial swelling. ?-In the ED he was noted to be in A-fib RVR, CTA head and neck noted enlarged thyroid, heterogenous bone density, labs notable for platelet of 112K and BNP of 477 ?-Improving on diuretics, atenolol ? ?Hospital Course:  ? ?Atrial fibrillation with RVR  ?- Presents with 4 wks of lightheadedness and intermittent swelling and found to be in new-onset atrial fibrillation with RVR ?- CHADS-VASc was only 1 (DM)  ?- TSH is <0.01 and free T4 elevated at 3.6, hyperthyroidism could be triggering this ?- Started on atenolol and methimazole ?-Appreciate cardiology input, off Cardizem now,  underwent TEE cardioversion yesterday, back in sinus rhythm ?-Continue apixaban at discharge ?-Follow-up with Dr. Geanie Logan in 2 weeks ?  ?Acute diastolic CHF ?Moderate PAH ?-Bilateral pleural effusions, edema, dyspnea on exertion ?-Possibly triggered by A-fib RVR ?-Echo with preserved EF, moderate PAH ?-Improving, he is 2.5 L negative, clinically appears euvolemic, start p.o. Lasix today  ?-Continue Farxiga at discharge ?  ?Hyperthyroidism; enlarged thyroid   ?- TSH is <0.01 in ED, thyroid enlarged and heterogeneous on CT  ?-Free T4 and T3 are elevated, continue atenolol ?-Started on methimazole, recommended endocrinology follow-up and thyroid ultrasound, referral sent ?  ?Thrombocytopenia  ?- Platelets 112k on admission mild, improved ?  ?Abnormal bone density  ?- Diffusely heterogenous bone density noted incidentally on CT in ED  ?- Calcium slightly low, creatinine normal  ?-Follow-up myeloma panel, kappa free light chains mildly elevated, ratio was normal, ?-Hematology follow-up needed ?  ?Type II DM  ?- A1c was 6.6% in October 2022  ?-Diet controlled, continue sliding scale insulin ?-Add metformin at discharge ?  ?Prolonged QT interval  ?- QTc is 515 ms in ED  ?-Improved ?  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  ?Dysarthria  ?- Patient reports 2 wks of intermittent dysarthria, most recently yesterday evening and ?- No acute intracranial abnormality noted on head CT, and no focal deficit identified on exam  ?-MRI brain unremarkable ?-?  TIA in the setting of A-fib, now resolved, monitor ?  ? ?Discharge Exam: ?Vitals:  ? 12/18/21 0853 12/18/21 1256  ?BP: (!) 103/59 (!) 100/55  ?Pulse: 62   ?  Resp:  17  ?Temp:  98.6 ?F (37 ?C)  ?SpO2:    ? ? ? ? ?Discharge Instructions ? ? ?Discharge Instructions   ? ? Amb referral to [facility]   Complete by: As directed ?  ? Ambulatory referral to Endocrinology   Complete by: As directed ?  ? Hyperthyroidism   ? Diet - low sodium heart healthy   Complete by: As directed ?  ? Diet Carb Modified   Complete by: As directed ?  ? Discharge instructions   Complete by: As directed ?  ? Please contact [place] via the [phone] number on his insurance card to get established with a PCP in network in his area  ? Increase activity slowly   Complete by: As directed ?  ? ?  ? ?Allergies as of 12/18/2021   ?No Known Allergies ?  ? ?  ?Medication List  ?  ? ?TAKE these medications   ? ?apixaban 5 MG Tabs tablet ?Commonly known as: ELIQUIS ?Take 1 tablet (5 mg total) by mouth 2 (two) times daily. ?  ?atenolol 25 MG tablet ?Commonly known as: TENORMIN ?Take 1 tablet (25 mg total) by mouth 2 (two) times daily. ?  ?dapagliflozin propanediol 10 MG Tabs tablet ?Commonly known as: FARXIGA ?Take 1 tablet (10 mg total) by mouth daily. ?  ?digoxin 0.125 MG tablet ?Commonly known as: LANOXIN ?Take 1 tablet (0.125 mg total) by mouth daily. ?  ?furosemide 20 MG tablet ?Commonly known as: LASIX ?Take 1 tablet (20 mg total) by mouth daily. ?  ?metFORMIN 500 MG tablet ?Commonly known as: Glucophage ?Take 1 tablet (500 mg total) by mouth daily with breakfast. ?  ?methimazole 5 MG tablet ?Commonly known as: TAPAZOLE ?Take 1 tablet (5 mg total) by mouth 2 (two) times daily. ?  ? ?  ? ?No Known Allergies ? ? ? ?The results of significant diagnostics from this hospitalization (including imaging, microbiology, ancillary and laboratory) are listed below for reference.   ? ?Significant Diagnostic Studies: ?CT ANGIO HEAD NECK W WO CM ? ?Result Date: 12/14/2021 ?CLINICAL DATA:  Neuro deficit, acute, stroke suspected. Weakness, dizziness, and facial swelling for 1 month. Increased symptoms today. EXAM: CT ANGIOGRAPHY HEAD AND NECK TECHNIQUE: Multidetector CT imaging of the head and neck was performed using the standard protocol during bolus administration of intravenous contrast. Multiplanar CT image reconstructions and MIPs were obtained to evaluate the vascular anatomy.  Carotid stenosis measurements (when applicable) are obtained utilizing NASCET criteria, using the distal internal carotid diameter as the denominator. RADIATION DOSE REDUCTION: This exam was performed according to the departmental dose-optimization program which includes automated exposure control, adjustment of the mA and/or kV according to patient size and/or use of iterative reconstruction technique. CONTRAST:  39mL OMNIPAQUE IOHEXOL 350 MG/ML SOLN COMPARISON:  None Available. FINDINGS: CT HEAD FINDINGS Brain: There is no evidence of an acute infarct, intracranial hemorrhage, mass, midline shift, or extra-axial fluid collection. The ventricles and sulci are within normal limits for age. Vascular: Reported below. Skull: No fracture. Sinuses: Visualized paranasal sinuses and mastoid air cells are clear. Orbits: Unremarkable. Review of the MIP images confirms the above findings CTA NECK FINDINGS Aortic arch: Standard 3 vessel aortic arch. Wide patency of the brachiocephalic and subclavian arteries. Right carotid system: Patent without evidence of stenosis, dissection, or significant atherosclerosis. Left carotid system: Patent without evidence of stenosis, dissection, or significant atherosclerosis. Vertebral arteries: Patent without evidence of stenosis, dissection, or significant atherosclerosis. Codominant. Skeleton: Multiple dental caries and periapical  lucencies. Diffusely heterogeneous bone density. Other neck: Moderate diffuse enlargement and mild heterogeneity of the thyroid gland. Upper chest: Partially visualized moderate right and small left pleural effusions with compressive atelectasis on the right. Review of the MIP images confirms the above findings CTA HEAD FINDINGS Anterior circulation: The internal carotid arteries are widely patent from skull base to carotid termini there is a 2 mm inferiorly directed outpouching from the right paraclinoid ICA. ACAs and MCAs are patent without evidence of a  proximal branch occlusion or significant proximal stenosis. Posterior circulation: The intracranial vertebral arteries are patent to the basilar. Patent bilateral PICA, right AICA, and bilateral SCA origins a

## 2021-12-20 ENCOUNTER — Encounter (HOSPITAL_COMMUNITY): Payer: Self-pay | Admitting: Cardiology

## 2021-12-20 LAB — UPEP/UIFE/LIGHT CHAINS/TP, 24-HR UR
% BETA, Urine: 0 %
ALPHA 1 URINE: 0 %
Albumin, U: 0 %
Alpha 2, Urine: 0 %
Free Kappa Lt Chains,Ur: 8.06 mg/L (ref 1.17–86.46)
Free Kappa/Lambda Ratio: 5.45 (ref 1.83–14.26)
Free Lambda Lt Chains,Ur: 1.48 mg/L (ref 0.27–15.21)
GAMMA GLOBULIN URINE: 0 %
Total Protein, Urine-Ur/day: <148 mg/24 hr (ref 30–150)
Total Protein, Urine: <4 mg/dL
Total Volume: 3700

## 2021-12-21 LAB — MULTIPLE MYELOMA PANEL, SERUM
Albumin SerPl Elph-Mcnc: 3.5 g/dL (ref 2.9–4.4)
Albumin/Glob SerPl: 1.2 (ref 0.7–1.7)
Alpha 1: 0.2 g/dL (ref 0.0–0.4)
Alpha2 Glob SerPl Elph-Mcnc: 0.5 g/dL (ref 0.4–1.0)
B-Globulin SerPl Elph-Mcnc: 0.9 g/dL (ref 0.7–1.3)
Gamma Glob SerPl Elph-Mcnc: 1.4 g/dL (ref 0.4–1.8)
Globulin, Total: 3 g/dL (ref 2.2–3.9)
IgA: 308 mg/dL (ref 61–437)
IgG (Immunoglobin G), Serum: 1188 mg/dL (ref 603–1613)
IgM (Immunoglobulin M), Srm: 80 mg/dL (ref 20–172)
Total Protein ELP: 6.5 g/dL (ref 6.0–8.5)

## 2022-01-04 ENCOUNTER — Encounter: Payer: Self-pay | Admitting: Cardiology

## 2022-01-04 ENCOUNTER — Ambulatory Visit: Payer: Commercial Managed Care - PPO | Admitting: Cardiology

## 2022-01-04 VITALS — BP 142/76 | HR 67 | Temp 98.7°F | Resp 17 | Ht 64.0 in | Wt 109.0 lb

## 2022-01-04 DIAGNOSIS — Z7901 Long term (current) use of anticoagulants: Secondary | ICD-10-CM

## 2022-01-04 DIAGNOSIS — E1165 Type 2 diabetes mellitus with hyperglycemia: Secondary | ICD-10-CM

## 2022-01-04 DIAGNOSIS — E0590 Thyrotoxicosis, unspecified without thyrotoxic crisis or storm: Secondary | ICD-10-CM

## 2022-01-04 DIAGNOSIS — M899 Disorder of bone, unspecified: Secondary | ICD-10-CM

## 2022-01-04 DIAGNOSIS — I5032 Chronic diastolic (congestive) heart failure: Secondary | ICD-10-CM

## 2022-01-04 DIAGNOSIS — D696 Thrombocytopenia, unspecified: Secondary | ICD-10-CM

## 2022-01-04 DIAGNOSIS — E049 Nontoxic goiter, unspecified: Secondary | ICD-10-CM

## 2022-01-04 DIAGNOSIS — I480 Paroxysmal atrial fibrillation: Secondary | ICD-10-CM

## 2022-01-04 LAB — ECHO TEE
MV M vel: 4.91 m/s
MV Peak grad: 96.4 mmHg
Radius: 0.4 cm

## 2022-01-04 MED ORDER — ATENOLOL 25 MG PO TABS: 25.0000 mg | ORAL_TABLET | Freq: Two times a day (BID) | ORAL | 0 refills | Status: DC

## 2022-01-04 NOTE — Progress Notes (Signed)
ID:  Akhil Piscopo, DOB 1955/09/22, MRN [id number]  PCP:  Oneita Hurt, No  Cardiologist:  Tessa Lerner, DO, [facility] (established care Dec 15, 2021)   Chief Complaint  Patient presents with   Hospitalization Follow-up    A-fib and heart failure management    HPI  Martavious Tat Gartrell is a 66 y.o. [place] male whose past medical history and cardiovascular risk factors include: Non-insulin-dependent diabetes mellitus type 2, hypothyroidism, paroxysmal atrial fibrillation requiring TEE guided cardioversion May 2023, former smoker.  During his hospitalization in May 2023 cardiology was consulted for management of atrial fibrillation in setting of hyperthyroidism and congestive heart failure.  In the emergency room department he was noted to be in A-fib with RVR and was placed on IV heparin and Cardizem drip.  BNP on arrival was 477, TSH <0.01.  Patient started on parenteral as well as oral AV nodal blocking agents.  His resting rate improved but with exertion he would be symptomatic.  He subsequently underwent TEE guided cardioversion and normal sinus rhythm was restored.  Patient was started on medical therapy for HFpEF at the time of the discharge.  He was supposed to establish care with PCP and have labs done prior to today's office visit.  Unfortunately, he has not followed up with PCP, establish care with endocrinology or hematology oncology.  From a cardiovascular standpoint he continues to have orthopnea, paroxysmal nocturnal dyspnea.  Lower extremity swelling has improved.  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  He ran out of atenolol yesterday which was started for rate control in the setting of hyperthyroidism.  He is requesting refills.  Patient predominately speaks [place] and therefore his son who was present at today's office visit axis and interpreter and he is accompanied by his ex-wife as well.  ALLERGIES: No Known Allergies  MEDICATION LIST PRIOR TO VISIT: Current Meds  Medication Sig    apixaban (ELIQUIS) 5 MG TABS tablet Take 1 tablet (5 mg total) by mouth 2 (two) times daily.   dapagliflozin propanediol (FARXIGA) 10 MG TABS tablet Take 1 tablet (10 mg total) by mouth daily.   furosemide (LASIX) 20 MG tablet Take 1 tablet (20 mg total) by mouth daily.   metFORMIN (GLUCOPHAGE) 500 MG tablet Take 1 tablet (500 mg total) by mouth daily with breakfast.   methimazole (TAPAZOLE) 5 MG tablet Take 1 tablet (5 mg total) by mouth 2 (two) times daily.   [DISCONTINUED] atenolol (TENORMIN) 25 MG tablet Take 1 tablet (25 mg total) by mouth 2 (two) times daily.   [DISCONTINUED] digoxin (LANOXIN) 0.125 MG tablet Take 1 tablet (0.125 mg total) by mouth daily.     PAST MEDICAL HISTORY: Past Medical History:  Diagnosis Date   Arthritis    Atrial fibrillation (HCC)    Diabetes mellitus without complication (HCC)    GERD (gastroesophageal reflux disease)    Hyperthyroidism     PAST SURGICAL HISTORY: Past Surgical History:  Procedure Laterality Date   BUBBLE STUDY  12/17/2021   Procedure: BUBBLE STUDY;  Surgeon: Tessa Lerner, DO;  Location: [facility];  Service: Cardiovascular;;   CARDIOVERSION N/A 12/17/2021   Procedure: CARDIOVERSION;  Surgeon: Tessa Lerner, DO;  Location: [facility];  Service: Cardiovascular;  Laterality: N/A;   TEE WITHOUT CARDIOVERSION N/A 12/17/2021   Procedure: TRANSESOPHAGEAL ECHOCARDIOGRAM (TEE);  Surgeon: Tessa Lerner, DO;  Location: [facility];  Service: Cardiovascular;  Laterality: N/A;    FAMILY HISTORY: No family history of premature coronary disease or sudden cardiac death.  SOCIAL HISTORY:  The patient  reports that he has quit smoking. His smoking use included cigarettes. He has never used smokeless tobacco. He reports that he does not drink alcohol and does not use drugs.  REVIEW OF SYSTEMS: Review of Systems  Cardiovascular:  Positive for dyspnea on exertion, orthopnea and paroxysmal nocturnal dyspnea. Negative for chest pain, leg swelling,  near-syncope, palpitations and syncope.  Respiratory:  Positive for shortness of breath.    PHYSICAL EXAM:    01/04/2022    3:32 PM 01/04/2022    3:13 PM 12/18/2021   12:56 PM  Vitals with BMI  Height  5\' 4"    Weight  109 lbs   BMI  18.7   Systolic 142 142  Diastolic 76 83 55  Pulse 67 73     CONSTITUTIONAL: Appears older than stated age, speaks in 654, hemodynamically stable, no acute distress.  SKIN: Skin is warm and dry. No rash noted. No cyanosis. No pallor. No jaundice HEAD: Normocephalic and atraumatic.  EYES: No scleral icterus MOUTH/THROAT: Moist oral membranes.  NECK: JVP present. Thyromegaly noted.  Bilateral carotid bruits right greater than left. CHEST Normal respiratory effort. No intercostal retractions  LUNGS: Clear to auscultation bilaterally.  No stridor. No wheezes. No rales.  CARDIOVASCULAR: Regular, bradycardic, positive S1-S2, no murmurs rubs or gallops appreciated. ABDOMINAL: Soft, nontender, nondistended, positive bowel sounds in all 4 quadrants, no apparent ascites.  EXTREMITIES: No peripheral edema, warm to touch, 2+ bilateral DP and PT pulses HEMATOLOGIC: No significant bruising NEUROLOGIC: Oriented to person, place, and time. Nonfocal. Normal muscle tone.  PSYCHIATRIC: Normal mood and affect. Normal behavior. Cooperative  CARDIAC DATABASE: EKG: 01/04/2022: Normal sinus rhythm, 68 bpm, normal axis, without underlying injury pattern.  Echocardiogram: No results found for this or any previous visit from the past 1095 days.    Stress Testing: No results found for this or any previous visit from the past 1095 days.   Heart Catheterization: None  LABORATORY DATA:    Latest Ref Rng & Units 12/18/2021    3:44 AM 12/17/2021    2:59 AM 12/16/2021    2:49 AM  CBC  WBC 4.0 - 10.5 K/uL 6.8   7.4   6.5    Hemoglobin 13.0 - 17.0 g/dL 02/15/2022   65.0   35.4    Hematocrit 39.0 - 52.0 % 41.0   41.0   37.0    Platelets 150 - 400 K/uL 164   149   121          Latest Ref Rng & Units 12/18/2021    3:44 AM 12/17/2021    2:59 AM 12/16/2021    2:49 AM  CMP  Glucose 70 - 99 mg/dL 02/15/2022   88   812    BUN 8 - 23 mg/dL 30   22   14     Creatinine 0.61 - 1.24 mg/dL 751     7.00    Sodium 135 - 145 mmol/L 135   135   140    Potassium 3.5 - 5.1 mmol/L 3.7   4.1   4.3    Chloride 98 - 111 mmol/L 101   105   113    CO2 22 - 32 mmol/L 25   19   21     Calcium 8.9 - 10.3 mg/dL 8.8   8.7   8.7    Total Protein 6.5 - 8.1 g/dL   6.2    Total Bilirubin 0.3 - 1.2 mg/dL   1.2  Alkaline Phos 38 - 126 U/L   181    AST 15 - 41 U/L   24    ALT 0 - 44 U/L   21      Lipid Panel     Component Value Date/Time   CHOL 84 12/15/2021 1855   TRIG 35 12/15/2021 1855   HDL 26 (L) 12/15/2021 1855   CHOLHDL 3.2 12/15/2021 1855   VLDL 7 12/15/2021 1855   LDLCALC 51 12/15/2021 1855   LDLDIRECT 50.1 12/15/2021 1851    No components found for: NTPROBNP No results for input(s): PROBNP in the last 8760 hours. Recent Labs    12/14/21 1425  TSH <0.010*    BMP Recent Labs    12/16/21 0249 12/17/21 0259 12/18/21 0344  NA 140 135 135  K 4.3 4.1 3.7  CL 113* 105 101  CO2 21* 19* 25  GLUCOSE 111* 88 106*  BUN 14 22 30*  CREATININE 0.64 0.73 0.61  CALCIUM 8.7* 8.7* 8.8*  GFRNONAA >60 >60 >60    HEMOGLOBIN A1C Lab Results  Component Value Date   HGBA1C 6.5 (H) 12/15/2021   MPG 139.85 12/15/2021    IMPRESSION:    ICD-10-CM   1. Paroxysmal A-fib (HCC)  I48.0 EKG 12-Lead    2. Long term (current) use of anticoagulants  Z79.01 Hemoglobin and hematocrit, blood    3. Chronic heart failure with preserved ejection fraction (HCC)  I50.32 Basic metabolic panel    Hemoglobin and hematocrit, blood    Pro b natriuretic peptide (BNP)    Magnesium    MR CARDIAC MORPHOLOGY W WO CONTRAST    atenolol (TENORMIN) 25 MG tablet    4. Type 2 diabetes mellitus with hyperglycemia, without long-term current use of insulin (HCC)  E11.65     5. Enlarged thyroid   E04.9     6. Hyperthyroidism  E05.90     7. Bone disease  M89.9     8. Thrombocytopenia (HCC)  D69.6        RECOMMENDATIONS: Patryck Tat Flowers is a 66 y.o. [place] male whose past medical history and cardiac risk factors include: Non-insulin-dependent diabetes mellitus type 2, hypothyroidism, paroxysmal atrial fibrillation requiring TEE guided cardioversion May 2023, former smoker.  Paroxysmal A-fib (HCC) Rate control: Atenolol, in the setting of hyperthyroidism. Rhythm control: N/A. Thromboembolic prophylaxis: Eliquis [id number] SCORE is 2 which correlates to 2% risk of stroke per year (CHF, diabetes).  EKG noted sinus bradycardia. Discontinue digoxin. Recommend establishing care with endocrinology in the setting of hyperthyroidism and thyromegaly. Underwent TEE guided cardioversion 12/17/2021.  Long term (current) use of anticoagulants Indication paroxysmal atrial fibrillation. Does not endorse evidence of bleeding. Risks, benefits, and alternatives of anticoagulation discussed with the patient and his son at today's office visit. They are asked to seek medical attention if there is signs of bleeding or if he sustains injury to rule out internal bleeding.  They verbalized understanding  Chronic heart failure with preserved ejection fraction (HCC) Stage C, NYHA class II/III Likely due to underlying A-fib with RVR in the setting of hyperthyroidism He continues to have symptoms of orthopnea and PND. Hospital discharge weight 45.5 kg and today's weight is 49.4 kg. Difficult to uptitrate medical therapy as he did not bring his medications in for review oral medication list.  In addition he was supposed to have labs postdischarge which are still pending. We will check BMP, magnesium level, NT proBNP. Atenolol refilled Digoxin discontinued as his ventricular rate is well controlled As long  as his renal function is stable would like to discontinue Lasix and start Entresto. Cardiac  MRI in the setting of heart failure for further stratification. Reemphasized importance of strict I's and O's, daily weights.  Type 2 diabetes mellitus with hyperglycemia, without long-term current use of insulin (HCC) Patient is encouraged to establish care with PCP. Currently on metformin. We will follow peripherally.  Enlarged thyroid / Hyperthyroidism Patient was supposed to establish care with endocrinology this is still pending according to his son.   Reemphasized its importance Currently on atenolol and methimazole.  Bone disease / Thrombocytopenia (HCC) CT scan performed in the ED noted nonspecific diffusely heterogenous bone disease. Myeloma work-up was initiated at the hospital. Results of the SPEP and UPEP reviewed. No significant M spike. Kappa free light chains elevated but kappa to lambda ratio within normal limits. Reemphasized the importance of establishing care with hematology oncology.  Patient and son verbalized understanding  Data Reviewed: I have independently reviewed notes from his recent hospitalization including H&P, provider progress notes, discharge summary, echo and TEE reports  I have independently reviewed results of echo, TEE as part of medical decision making. I have ordered the following tests: Cardiac MRI, labs I have made medications changes at today's encounter as noted above. History of present illness was obtained by both patient and his son and ex-wife at today's office visit.   His son also served as an Equities trader as the patient predominately speaks [place]  He can be reached at [phone].  FINAL MEDICATION LIST END OF ENCOUNTER: Meds ordered this encounter  Medications   atenolol (TENORMIN) 25 MG tablet    Sig: Take 1 tablet (25 mg total) by mouth 2 (two) times daily.    Dispense:  180 tablet    Refill:  0    Medications Discontinued During This Encounter  Medication Reason   atenolol (TENORMIN) 25 MG tablet Reorder   digoxin  (LANOXIN) 0.125 MG tablet Discontinued by provider     Current Outpatient Medications:    apixaban (ELIQUIS) 5 MG TABS tablet, Take 1 tablet (5 mg total) by mouth 2 (two) times daily., Disp: 60 tablet, Rfl: 0   dapagliflozin propanediol (FARXIGA) 10 MG TABS tablet, Take 1 tablet (10 mg total) by mouth daily., Disp: 30 tablet, Rfl: 0   furosemide (LASIX) 20 MG tablet, Take 1 tablet (20 mg total) by mouth daily., Disp: 30 tablet, Rfl: 0   metFORMIN (GLUCOPHAGE) 500 MG tablet, Take 1 tablet (500 mg total) by mouth daily with breakfast., Disp: 30 tablet, Rfl: 0   methimazole (TAPAZOLE) 5 MG tablet, Take 1 tablet (5 mg total) by mouth 2 (two) times daily., Disp: 60 tablet, Rfl: 0   atenolol (TENORMIN) 25 MG tablet, Take 1 tablet (25 mg total) by mouth 2 (two) times daily., Disp: 180 tablet, Rfl: 0  Orders Placed This Encounter  Procedures   MR CARDIAC MORPHOLOGY W WO CONTRAST   Basic metabolic panel   Hemoglobin and hematocrit, blood   Pro b natriuretic peptide (BNP)   Magnesium   EKG 12-Lead    There are no Patient Instructions on file for this visit.   --Continue cardiac medications as reconciled in final medication list. --Return in about 4 weeks (around 02/01/2022) for Follow up, heart failure management.. or sooner if needed. --Continue follow-up with your primary care physician regarding the management of your other chronic comorbid conditions.  Patient's questions and concerns were addressed to his satisfaction. He voices understanding of the instructions provided during this  encounter.   This note was created using a voice recognition software as a result there may be grammatical errors inadvertently enclosed that do not reflect the nature of this encounter. Every attempt is made to correct such errors.  Rex Kras, [place], [facility]  Pager: [phone] Office: [phone]

## 2022-01-05 LAB — BASIC METABOLIC PANEL
BUN/Creatinine Ratio: 17 (ref 10–24)
BUN: 15 mg/dL (ref 8–27)
CO2: 24 mmol/L (ref 20–29)
Calcium: 9.9 mg/dL (ref 8.6–10.2)
Chloride: 97 mmol/L (ref 96–106)
Creatinine, Ser: 0.87 mg/dL (ref 0.76–1.27)
Glucose: 102 mg/dL — ABNORMAL HIGH (ref 70–99)
Potassium: 4.4 mmol/L (ref 3.5–5.2)
Sodium: 138 mmol/L (ref 134–144)
eGFR: 96 mL/min/{1.73_m2} (ref >59)

## 2022-01-05 LAB — MAGNESIUM: Magnesium: 2.1 mg/dL (ref 1.6–2.3)

## 2022-01-05 LAB — HEMOGLOBIN AND HEMATOCRIT, BLOOD
Hematocrit: 51.4 % — ABNORMAL HIGH (ref 37.5–51.0)
Hemoglobin: 17.1 g/dL (ref 13.0–17.7)

## 2022-01-07 ENCOUNTER — Other Ambulatory Visit: Payer: Self-pay | Admitting: Cardiology

## 2022-01-07 DIAGNOSIS — I5032 Chronic diastolic (congestive) heart failure: Secondary | ICD-10-CM

## 2022-01-07 NOTE — Progress Notes (Signed)
Tried calling patient no answer left a vm to his son

## 2022-01-17 ENCOUNTER — Telehealth: Payer: Self-pay

## 2022-01-17 NOTE — Telephone Encounter (Signed)
Pts son called and stated that the pt needs all of his medications refilled. I see that some of them do not look like they were prescribed by you. Can we refill them? Pts son would also like to know if you were going to put the pt on any ned medications. Please advise.

## 2022-01-18 NOTE — Telephone Encounter (Signed)
Called pt, no answer. Left vm requesting call back?

## 2022-01-18 NOTE — Progress Notes (Signed)
Tried calling patient --no answer

## 2022-01-18 NOTE — Telephone Encounter (Signed)
Please see the last result note.  Let me know if you have questions.   Dr. Terri Skains

## 2022-01-19 ENCOUNTER — Other Ambulatory Visit: Payer: Self-pay

## 2022-01-19 MED ORDER — APIXABAN 5 MG PO TABS: 5.0000 mg | ORAL_TABLET | Freq: Two times a day (BID) | ORAL | 1 refills | Status: DC

## 2022-01-19 MED ORDER — FUROSEMIDE 20 MG PO TABS: 20.0000 mg | ORAL_TABLET | Freq: Every day | ORAL | 1 refills | Status: DC

## 2022-01-19 MED ORDER — ENTRESTO 49-51 MG PO TABS: 1.0000 | ORAL_TABLET | Freq: Two times a day (BID) | ORAL | 1 refills | Status: DC

## 2022-01-19 MED ORDER — DAPAGLIFLOZIN PROPANEDIOL 10 MG PO TABS: 10.0000 mg | ORAL_TABLET | Freq: Every day | ORAL | 1 refills | Status: DC

## 2022-01-19 NOTE — Telephone Encounter (Signed)
Pt aware.

## 2022-02-02 ENCOUNTER — Other Ambulatory Visit: Payer: Self-pay | Admitting: Cardiology

## 2022-02-02 DIAGNOSIS — Z7901 Long term (current) use of anticoagulants: Secondary | ICD-10-CM

## 2022-02-02 DIAGNOSIS — I480 Paroxysmal atrial fibrillation: Secondary | ICD-10-CM

## 2022-02-02 DIAGNOSIS — I5032 Chronic diastolic (congestive) heart failure: Secondary | ICD-10-CM

## 2022-02-04 ENCOUNTER — Ambulatory Visit: Payer: Commercial Managed Care - PPO | Admitting: Cardiology

## 2022-02-05 LAB — HEMOGLOBIN AND HEMATOCRIT, BLOOD
Hematocrit: 47.5 % (ref 37.5–51.0)
Hemoglobin: 16.7 g/dL (ref 13.0–17.7)

## 2022-02-05 LAB — BASIC METABOLIC PANEL
BUN/Creatinine Ratio: 25 — ABNORMAL HIGH (ref 10–24)
BUN: 16 mg/dL (ref 8–27)
CO2: 18 mmol/L — ABNORMAL LOW (ref 20–29)
Calcium: 9.5 mg/dL (ref 8.6–10.2)
Chloride: 101 mmol/L (ref 96–106)
Creatinine, Ser: 0.65 mg/dL — ABNORMAL LOW (ref 0.76–1.27)
Glucose: 239 mg/dL — ABNORMAL HIGH (ref 70–99)
Potassium: 4.7 mmol/L (ref 3.5–5.2)
Sodium: 140 mmol/L (ref 134–144)
eGFR: 105 mL/min/{1.73_m2} (ref >59)

## 2022-02-05 LAB — MAGNESIUM: Magnesium: 2.4 mg/dL — ABNORMAL HIGH (ref 1.6–2.3)

## 2022-02-05 LAB — PRO B NATRIURETIC PEPTIDE: NT-Pro BNP: 766 pg/mL — ABNORMAL HIGH (ref 0–210)

## 2022-02-16 NOTE — Progress Notes (Signed)
Called and spoke to patient's son he voiced understanding. Patient's son stated patient has been feeling very tired lately I ask patient's son if he could elaborate he just said his dad has been very tired and mention methimazole if patient could take that it would help with his thyroid

## 2022-02-25 ENCOUNTER — Ambulatory Visit: Payer: Commercial Managed Care - PPO | Admitting: Cardiology

## 2022-02-25 ENCOUNTER — Encounter: Payer: Self-pay | Admitting: Cardiology

## 2022-02-25 VITALS — BP 96/57 | HR 54 | Temp 98.0°F | Resp 16 | Ht 64.0 in | Wt 107.0 lb

## 2022-02-25 DIAGNOSIS — Z7901 Long term (current) use of anticoagulants: Secondary | ICD-10-CM

## 2022-02-25 DIAGNOSIS — I5032 Chronic diastolic (congestive) heart failure: Secondary | ICD-10-CM

## 2022-02-25 DIAGNOSIS — E0590 Thyrotoxicosis, unspecified without thyrotoxic crisis or storm: Secondary | ICD-10-CM

## 2022-02-25 DIAGNOSIS — I480 Paroxysmal atrial fibrillation: Secondary | ICD-10-CM

## 2022-02-25 DIAGNOSIS — E1165 Type 2 diabetes mellitus with hyperglycemia: Secondary | ICD-10-CM

## 2022-02-25 DIAGNOSIS — M899 Disorder of bone, unspecified: Secondary | ICD-10-CM

## 2022-02-25 MED ORDER — FUROSEMIDE 20 MG PO TABS: 20.0000 mg | ORAL_TABLET | Freq: Every day | ORAL | 1 refills | Status: DC | PRN

## 2022-02-25 MED ORDER — ENTRESTO 24-26 MG PO TABS: 1.0000 | ORAL_TABLET | Freq: Two times a day (BID) | ORAL | 0 refills | Status: AC

## 2022-02-25 NOTE — Progress Notes (Signed)
ID:  Gary Ryan, DOB 05-09-1956, MRN [id number]  PCP:  Arlan Organ, MD  Cardiologist:  Tessa Lerner, DO, [facility] (established care Dec 15, 2021)  Date: 02/25/22 Last Office Visit: 01/04/2022  Chief Complaint  Patient presents with   Atrial Fibrillation   Congestive Heart Failure   Follow-up    4 week    HPI  Gary Ryan is a 66 y.o. [place] male whose past medical history and cardiovascular risk factors include: Non-insulin-dependent diabetes mellitus type 2, hyperthyroidism, paroxysmal atrial fibrillation requiring TEE guided cardioversion May 2023, former smoker.  During his hospitalization in May 2023 cardiology was consulted for management of atrial fibrillation in the setting of hyperthyroidism and congestive heart failure.  Patient was started on parenteral medications and due to persistent A-fib with RVR underwent TEE guided cardioversion.  And since then has been on AV nodal blocking agents as well as Eliquis for thromboembolic prophylaxis.  With regards to heart failure management he was started on GDMT with focus of up titration on outpatient basis.  He presents today for follow-up.  He denies anginal chest pain or heart failure symptoms.  His noncardiac symptoms include difficulty talking, swallowing, and diaphoresis at times.  No focal neurological deficits.  He has establish care with PCP but is still awaiting endocrinology and hematology evaluation given his hyperthyroidism and bone disease respectively.   His blood pressure and weight log reviewed as part of today's encounter.  Patient's systolic blood pressures are well controlled until June 2023 has been having episodes of hypotension.  Patient's son is available at today's office visit and serves as an Equities trader.   ALLERGIES: No Known Allergies  MEDICATION LIST PRIOR TO VISIT: Current Meds  Medication Sig   apixaban (ELIQUIS) 5 MG TABS tablet Take 1 tablet (5 mg total) by mouth 2 (two) times daily.    atenolol (TENORMIN) 25 MG tablet Take 1 tablet (25 mg total) by mouth 2 (two) times daily.   dapagliflozin propanediol (FARXIGA) 10 MG TABS tablet Take 1 tablet (10 mg total) by mouth daily.   metFORMIN (GLUCOPHAGE) 500 MG tablet Take 1 tablet (500 mg total) by mouth daily with breakfast.   methimazole (TAPAZOLE) 5 MG tablet Take 1 tablet (5 mg total) by mouth 2 (two) times daily.   sacubitril-valsartan (ENTRESTO) 24-26 MG Take 1 tablet by mouth 2 (two) times daily.   [DISCONTINUED] furosemide (LASIX) 20 MG tablet Take 1 tablet (20 mg total) by mouth daily.   [DISCONTINUED] sacubitril-valsartan (ENTRESTO) 49-51 MG Take 1 tablet by mouth 2 (two) times daily.     PAST MEDICAL HISTORY: Past Medical History:  Diagnosis Date   (HFpEF) heart failure with preserved ejection fraction (HCC)    Arthritis    Atrial fibrillation (HCC)    Diabetes mellitus without complication (HCC)    GERD (gastroesophageal reflux disease)    Hyperthyroidism     PAST SURGICAL HISTORY: Past Surgical History:  Procedure Laterality Date   BUBBLE STUDY  12/17/2021   Procedure: BUBBLE STUDY;  Surgeon: Tessa Lerner, DO;  Location: [facility];  Service: Cardiovascular;;   CARDIOVERSION N/A 12/17/2021   Procedure: CARDIOVERSION;  Surgeon: Tessa Lerner, DO;  Location: [facility];  Service: Cardiovascular;  Laterality: N/A;   TEE WITHOUT CARDIOVERSION N/A 12/17/2021   Procedure: TRANSESOPHAGEAL ECHOCARDIOGRAM (TEE);  Surgeon: Tessa Lerner, DO;  Location: [facility];  Service: Cardiovascular;  Laterality: N/A;    FAMILY HISTORY: No family history of premature coronary disease or sudden cardiac death.  SOCIAL HISTORY:  The patient  reports that he has quit smoking. His smoking use included cigarettes. He has never used smokeless tobacco. He reports that he does not drink alcohol and does not use drugs.  REVIEW OF SYSTEMS: Review of Systems  Constitutional: Positive for malaise/fatigue.  Cardiovascular:   Positive for dyspnea on exertion and palpitations. Negative for chest pain, leg swelling, near-syncope, orthopnea, paroxysmal nocturnal dyspnea and syncope.  Respiratory:  Positive for shortness of breath.   Musculoskeletal:        Right shoulder pain    PHYSICAL EXAM:    02/25/2022    1:46 PM 01/04/2022    3:32 PM 01/04/2022    3:13 PM  Vitals with BMI  Height 5\' 4"   5\' 4"   Weight 107 lbs  109 lbs  BMI 18.36  18.7  Systolic 96 142 142  Diastolic 57 76 83  Pulse 54 67 73    CONSTITUTIONAL: Appears older than stated age, speaks in , hemodynamically stable, no acute distress.  SKIN: Skin is warm and dry. No rash noted. No cyanosis. No pallor. No jaundice HEAD: Normocephalic and atraumatic.  EYES: No scleral icterus MOUTH/THROAT: Moist oral membranes.  NECK: No JVP present. Thyromegaly noted.  Bilateral carotid bruits right greater than left. CHEST Normal respiratory effort. No intercostal retractions  LUNGS: Clear to auscultation bilaterally.  No stridor. No wheezes. No rales.  CARDIOVASCULAR: Regular, bradycardic, positive S1-S2, no murmurs rubs or gallops appreciated. ABDOMINAL: Soft, nontender, nondistended, positive bowel sounds in all 4 quadrants, no apparent ascites.  EXTREMITIES: No peripheral edema, warm to touch, 2+ bilateral DP and PT pulses HEMATOLOGIC: No significant bruising NEUROLOGIC: Oriented to person, place, and time. Nonfocal. Normal muscle tone.  PSYCHIATRIC: Normal mood and affect. Normal behavior. Cooperative  CARDIAC DATABASE: EKG: 01/04/2022: Normal sinus rhythm, 68 bpm, normal axis, without underlying injury pattern.  Echocardiogram: 12/15/2021:  1. Left ventricular ejection fraction, by estimation, is 60 to 65%. The  left ventricle has normal function. The left ventricle has no regional  wall motion abnormalities. Diastolic function indeterminant due to AFib.   2. Right ventricular systolic function is mildly reduced. The right  ventricular  size is mildly enlarged. There is moderately elevated  pulmonary artery systolic pressure. The estimated right ventricular  systolic pressure is 50.0 mmHg.   3. Left atrial size was moderately dilated.   4. Right atrial size was moderately dilated.   5. The mitral valve is grossly normal. There is moderate-to-severe  functional mitral regurgitation.   6. Tricuspid valve regurgitation is moderate to severe. Appears to be  functional with poor central coaptation of the TV leaflets.   7. The aortic valve is tricuspid. There is mild calcification of the  aortic valve. There is mild thickening of the aortic valve. Aortic valve  regurgitation is trivial. Aortic valve sclerosis/calcification is present,  without any evidence of aortic  stenosis.   8. The inferior vena cava is dilated in size with <50% respiratory  variability, suggesting right atrial pressure of 15 mmHg.     LABORATORY DATA:    Latest Ref Rng & Units 02/04/2022    1:24 PM 01/04/2022    4:35 PM 12/18/2021    3:44 AM  CBC  WBC 4.0 - 10.5 K/uL   6.8   Hemoglobin 13.0 - 17.7 g/dL 01/06/2022  12/20/2021  23.5   Hematocrit 37.5 - 51.0 % 47.5  51.4  41.0   Platelets 150 - 400 K/uL   164        Latest  Ref Rng & Units 02/04/2022    1:24 PM 01/04/2022    4:35 PM 12/18/2021    3:44 AM  CMP  Glucose 70 - 99 mg/dL 094  709  628   BUN 8 - 27 mg/dL 16  15  30    Creatinine 0.76 - 1.27 mg/dL  3.66  2.94   Sodium 134 - 144 mmol/L 140  138  135   Potassium 3.5 - 5.2 mmol/L 4.7  4.4  3.7   Chloride 96 - 106 mmol/L 101  97  101   CO2 20 - 29 mmol/L 18  24  25    Calcium 8.6 - 10.2 mg/dL 9.5  9.9  8.8     Lipid Panel     Component Value Date/Time   CHOL 84 12/15/2021 1855   TRIG 35 12/15/2021 1855   HDL 26 (L) 12/15/2021 1855   CHOLHDL 3.2 12/15/2021 1855   VLDL 7 12/15/2021 1855   LDLCALC 51 12/15/2021 1855   LDLDIRECT 50.1 12/15/2021 1851    No components found for: "NTPROBNP" Recent Labs    02/04/22 1324  PROBNP 766*    Recent Labs    12/14/21 1425  TSH <0.010*    BMP Recent Labs    12/16/21 0249 12/17/21 0259 12/18/21 0344 01/04/22 1635 02/04/22 1324  NA 140 135 135 138 140  K 4.3 4.1 3.7 4.4 4.7  CL 113* 105 101 97 101  CO2 21* 19* 25 24 18*  GLUCOSE 111* 88 106* 102* 239*  BUN 14 22 30* 15 16  CREATININE 0.64 0.73 0.61 0.87 0.65*  CALCIUM 8.7* 8.7* 8.8* 9.9 9.5  GFRNONAA >60 >60 >60  --   --     HEMOGLOBIN A1C Lab Results  Component Value Date   HGBA1C 6.5 (H) 12/15/2021   MPG 139.85 12/15/2021    IMPRESSION:    ICD-10-CM   1. Chronic heart failure with preserved ejection fraction (HCC)  I50.32 EKG 12-Lead    sacubitril-valsartan (ENTRESTO) 24-26 MG    furosemide (LASIX) 20 MG tablet    2. Paroxysmal A-fib (HCC)  I48.0     3. Long term (current) use of anticoagulants  Z79.01     4. Type 2 diabetes mellitus with hyperglycemia, without long-term current use of insulin (HCC)  E11.65     5. Hyperthyroidism  E05.90     6. Bone disease  M89.9        RECOMMENDATIONS: Aroldo Tat Quach is a 66 y.o. [place] male whose past medical history and cardiac risk factors include: Non-insulin-dependent diabetes mellitus type 2, hypothyroidism, paroxysmal atrial fibrillation requiring TEE guided cardioversion May 2023, former smoker.  Chronic heart failure with preserved ejection fraction (HCC) Euvolemic on physical examination. Weight remains relatively stable since last office visit. Due to soft blood pressures well decrease Entresto from 49/51 mg p.o. twice daily to 24/26 mg p.o. twice daily Patient is also asked to use Lasix on a as needed basis until he is back to his dry weight. Discontinue digoxin. Hospital discharge weight 45.5 kg.  Outside labs from June 2023 independently reviewed and noted above for further reference. Cardiac MRI still pending. Reemphasized importance of strict I's and O's and daily weights.  Paroxysmal A-fib (HCC) Rate control: Atenolol. Rhythm  control: N/A. Thromboembolic prophylaxis: Eliquis.  CHA2DS2-VASc SCORE is 2 which correlates to 2 % risk of stroke per year (CHF, diabetes). EKG re-illustrates sinus bradycardia. Discontinue digoxin.  Underwent TEE guided cardioversion Dec 17, 2021.  Long term (current) use  of anticoagulants Indication paroxysmal atrial fibrillation. Does not endorse evidence of bleeding. Risks, benefits, and alternatives to oral anticoagulation discussed with the patient and his son at today's office visit.  Type 2 diabetes mellitus with hyperglycemia, without long-term current use of insulin (HCC) Reemphasized importance of glycemic control. Currently managed by primary care provider.  Hyperthyroidism Currently on methimazole and atenolol. Patient has an appointment with endocrinology in September 2023.   I have asked him to also reach out to Dr. Talmage Nap to see if he can be seen sooner.  Bone disease During his past hospitalization he had a CT scan in the ED which noted nonspecific diffusely heterogenous bone disease. He was recommended to follow-up with hematology oncology for further evaluation and management, this is still pending.  Results of the SPEP and UPEP reviewed. No significant M spike. Kappa free light chains elevated but kappa to lambda ratio within normal limits.  His son also served as an Equities trader as the patient predominately speaks [place]  He can be reached at [phone].  FINAL MEDICATION LIST END OF ENCOUNTER: Meds ordered this encounter  Medications   sacubitril-valsartan (ENTRESTO) 24-26 MG    Sig: Take 1 tablet by mouth 2 (two) times daily.    Dispense:  180 tablet    Refill:  0   furosemide (LASIX) 20 MG tablet    Sig: Take 1 tablet (20 mg total) by mouth daily as needed for fluid or edema.    Dispense:  90 tablet    Refill:  1    Medications Discontinued During This Encounter  Medication Reason   sacubitril-valsartan (ENTRESTO) 49-51 MG Dose change    furosemide (LASIX) 20 MG tablet      Current Outpatient Medications:    apixaban (ELIQUIS) 5 MG TABS tablet, Take 1 tablet (5 mg total) by mouth 2 (two) times daily., Disp: 180 tablet, Rfl: 1   atenolol (TENORMIN) 25 MG tablet, Take 1 tablet (25 mg total) by mouth 2 (two) times daily., Disp: 180 tablet, Rfl: 0   dapagliflozin propanediol (FARXIGA) 10 MG TABS tablet, Take 1 tablet (10 mg total) by mouth daily., Disp: 90 tablet, Rfl: 1   metFORMIN (GLUCOPHAGE) 500 MG tablet, Take 1 tablet (500 mg total) by mouth daily with breakfast., Disp: 30 tablet, Rfl: 0   methimazole (TAPAZOLE) 5 MG tablet, Take 1 tablet (5 mg total) by mouth 2 (two) times daily., Disp: 60 tablet, Rfl: 0   sacubitril-valsartan (ENTRESTO) 24-26 MG, Take 1 tablet by mouth 2 (two) times daily., Disp: 180 tablet, Rfl: 0   furosemide (LASIX) 20 MG tablet, Take 1 tablet (20 mg total) by mouth daily as needed for fluid or edema., Disp: 90 tablet, Rfl: 1  Orders Placed This Encounter  Procedures   EKG 12-Lead    There are no Patient Instructions on file for this visit.   --Continue cardiac medications as reconciled in final medication list. --Return in about 3 months (around 05/28/2022) for Follow up, heart failure management.. or sooner if needed. --Continue follow-up with your primary care physician regarding the management of your other chronic comorbid conditions.  Patient's questions and concerns were addressed to his satisfaction. He voices understanding of the instructions provided during this encounter.   This note was created using a voice recognition software as a result there may be grammatical errors inadvertently enclosed that do not reflect the nature of this encounter. Every attempt is made to correct such errors.  Tessa Lerner, [place], [facility]  Pager: [phone] Office: [phone]

## 2022-02-28 ENCOUNTER — Telehealth: Payer: Self-pay | Admitting: Cardiology

## 2022-02-28 NOTE — Telephone Encounter (Signed)
Gary Ryan's son says he was told he could see Dr. Lisabeth Devoid for endocrinology, but their office informed him they need a referral. He's asking if this can be done so he can make an appointment for his father (Gary Ryan).

## 2022-02-28 NOTE — Telephone Encounter (Signed)
Please have Meriam Sprague send this over for RE: Hyperthyroidism.

## 2022-03-23 ENCOUNTER — Other Ambulatory Visit: Payer: Self-pay | Admitting: Endocrinology

## 2022-03-23 DIAGNOSIS — E049 Nontoxic goiter, unspecified: Secondary | ICD-10-CM

## 2022-04-01 ENCOUNTER — Ambulatory Visit
Admission: RE | Admit: 2022-04-01 | Discharge: 2022-04-01 | Disposition: A | Discharge status: Home / Self Care | Payer: Commercial Managed Care - PPO | Source: Ambulatory Visit | Attending: Endocrinology | Admitting: Endocrinology

## 2022-04-01 DIAGNOSIS — E049 Nontoxic goiter, unspecified: Secondary | ICD-10-CM

## 2022-04-07 ENCOUNTER — Other Ambulatory Visit: Payer: Self-pay | Admitting: Cardiology

## 2022-04-07 DIAGNOSIS — I5032 Chronic diastolic (congestive) heart failure: Secondary | ICD-10-CM

## 2022-06-03 ENCOUNTER — Ambulatory Visit: Payer: Commercial Managed Care - PPO | Admitting: Cardiology

## 2022-06-03 ENCOUNTER — Encounter: Payer: Self-pay | Admitting: Cardiology

## 2022-06-03 VITALS — BP 93/50 | HR 45 | Ht 64.0 in | Wt 119.6 lb

## 2022-06-03 DIAGNOSIS — M899 Disorder of bone, unspecified: Secondary | ICD-10-CM

## 2022-06-03 DIAGNOSIS — E0590 Thyrotoxicosis, unspecified without thyrotoxic crisis or storm: Secondary | ICD-10-CM

## 2022-06-03 DIAGNOSIS — I5032 Chronic diastolic (congestive) heart failure: Secondary | ICD-10-CM

## 2022-06-03 DIAGNOSIS — Z7901 Long term (current) use of anticoagulants: Secondary | ICD-10-CM

## 2022-06-03 DIAGNOSIS — I480 Paroxysmal atrial fibrillation: Secondary | ICD-10-CM

## 2022-06-03 DIAGNOSIS — E1165 Type 2 diabetes mellitus with hyperglycemia: Secondary | ICD-10-CM

## 2022-06-03 MED ORDER — ENTRESTO 24-26 MG PO TABS: 1.0000 | ORAL_TABLET | Freq: Two times a day (BID) | ORAL | 0 refills | Status: AC

## 2022-06-03 MED ORDER — ATENOLOL 25 MG PO TABS: 25.0000 mg | ORAL_TABLET | Freq: Every day | ORAL | 0 refills | Status: DC

## 2022-06-03 NOTE — Progress Notes (Signed)
ID:  Gary Ryan, DOB 08-30-1955, MRN [id number]  PCP:  Arlan Organ, MD  Cardiologist:  Tessa Lerner, DO, [facility] (established care Dec 15, 2021)  Date: 06/03/22 Last Office Visit: 02/25/2022  Chief Complaint  Patient presents with  . Congestive Heart Failure  . Follow-up    HPI  Gary Ryan is a 66 y.o. [place] male whose past medical history and cardiovascular risk factors include: Non-insulin-dependent diabetes mellitus type 2, hyperthyroidism, paroxysmal atrial fibrillation requiring TEE guided cardioversion May 2023, former smoker.  During his hospitalization in May 2023 cardiology was consulted for management of atrial fibrillation in the setting of hyperthyroidism and congestive heart failure.  Patient was started on parenteral medications and due to persistent A-fib with RVR underwent TEE guided cardioversion.  And since then has been on AV nodal blocking agents as well as Eliquis for thromboembolic prophylaxis.  With regards to heart failure management he was started on GDMT with focus of up titration on outpatient basis.  He presents today for follow-up.  He denies anginal chest pain or heart failure symptoms.  His noncardiac symptoms include difficulty talking, swallowing, and diaphoresis at times.  No focal neurological deficits.  He has establish care with PCP but is still awaiting endocrinology and hematology evaluation given his hyperthyroidism and bone disease respectively.   His blood pressure and weight log reviewed as part of today's encounter.  Patient's systolic blood pressures are well controlled until June 2023 has been having episodes of hypotension.  Patient's son is available at today's office visit and serves as an Equities trader.    Gary Ryan  Tired  No syncope Back to working  Goiter swollen 04/2022 beginning  ?cancer SBP <110  ALLERGIES: No Known Allergies  MEDICATION LIST PRIOR TO VISIT: Current Meds  Medication Sig  . apixaban (ELIQUIS) 5  MG TABS tablet Take 1 tablet (5 mg total) by mouth 2 (two) times daily.  . Cholecalciferol (D3 2000 PO) Take by mouth.  . dapagliflozin propanediol (FARXIGA) 10 MG TABS tablet Take 1 tablet (10 mg total) by mouth daily.  . digoxin (LANOXIN) 0.125 MG tablet Take 125 mcg by mouth daily.  . furosemide (LASIX) 20 MG tablet Take 1 tablet (20 mg total) by mouth daily as needed for fluid or edema.  . metFORMIN (GLUCOPHAGE) 500 MG tablet Take 1 tablet by mouth daily with breakfast.  . sacubitril-valsartan (ENTRESTO) 24-26 MG Take 1 tablet by mouth 2 (two) times daily.  . [DISCONTINUED] atenolol (TENORMIN) 25 MG tablet TAKE 1 TABLET(25 MG) BY MOUTH TWICE DAILY  . [DISCONTINUED] ENTRESTO 49-51 MG Take 1 tablet by mouth 2 (two) times daily.     PAST MEDICAL HISTORY: Past Medical History:  Diagnosis Date  . (HFpEF) heart failure with preserved ejection fraction (HCC)   . Arthritis   . Atrial fibrillation (HCC)   . Diabetes mellitus without complication (HCC)   . GERD (gastroesophageal reflux disease)   . Hyperthyroidism     PAST SURGICAL HISTORY: Past Surgical History:  Procedure Laterality Date  . BUBBLE STUDY  12/17/2021   Procedure: BUBBLE STUDY;  Surgeon: Tessa Lerner, DO;  Location: [facility];  Service: Cardiovascular;;  . CARDIOVERSION N/A 12/17/2021   Procedure: CARDIOVERSION;  Surgeon: Tessa Lerner, DO;  Location: [facility] ENDOSCOPY;  Service: Cardiovascular;  Laterality: N/A;  . TEE WITHOUT CARDIOVERSION N/A 12/17/2021   Procedure: TRANSESOPHAGEAL ECHOCARDIOGRAM (TEE);  Surgeon: Tessa Lerner, DO;  Location: [facility] ENDOSCOPY;  Service: Cardiovascular;  Laterality: N/A;    FAMILY HISTORY: No family  history of premature coronary disease or sudden cardiac death.  SOCIAL HISTORY:  The patient  reports that he has quit smoking. His smoking use included cigarettes. He has never used smokeless tobacco. He reports that he does not drink alcohol and does not use drugs.  REVIEW OF SYSTEMS: Review of  Systems  Constitutional: Positive for malaise/fatigue and weight gain.  Cardiovascular:  Negative for chest pain, dyspnea on exertion, leg swelling, near-syncope, orthopnea, palpitations, paroxysmal nocturnal dyspnea and syncope.  Respiratory:  Positive for cough and shortness of breath.   Musculoskeletal:        Right shoulder pain    PHYSICAL EXAM:    06/03/2022    2:22 PM 02/25/2022    1:46 PM 01/04/2022    3:32 PM  Vitals with BMI  Height 5\' 4"  5\' 4"    Weight 119 lbs 10 oz 107 lbs   BMI 20.52 18.36   Systolic 93 96 142  Diastolic 50 57 76  Pulse 45 54 67    CONSTITUTIONAL: Appears older than stated age, speaks in , hemodynamically stable, no acute distress.  SKIN: Skin is warm and dry. No rash noted. No cyanosis. No pallor. No jaundice HEAD: Normocephalic and atraumatic.  EYES: No scleral icterus MOUTH/THROAT: Moist oral membranes.  NECK: No JVP present. Thyromegaly noted.  Bilateral carotid bruits right greater than left. CHEST Normal respiratory effort. No intercostal retractions  LUNGS: Clear to auscultation bilaterally.  No stridor. No wheezes. No rales.  CARDIOVASCULAR: Regular, bradycardic, positive S1-S2, no murmurs rubs or gallops appreciated. ABDOMINAL: Soft, nontender, nondistended, positive bowel sounds in all 4 quadrants, no apparent ascites.  EXTREMITIES: No peripheral edema, warm to touch, 2+ bilateral DP and PT pulses HEMATOLOGIC: No significant bruising NEUROLOGIC: Oriented to person, place, and time. Nonfocal. Normal muscle tone.  PSYCHIATRIC: Normal mood and affect. Normal behavior. Cooperative  CARDIAC DATABASE: EKG: 01/04/2022: Normal sinus rhythm, 68 bpm, normal axis, without underlying injury pattern. 06/03/2022: Sinus bradycardia, 40 bpm, nonspecific T wave abnormality, without underlying injury pattern.  Echocardiogram: 12/15/2021:  1. Left ventricular ejection fraction, by estimation, is 60 to 65%. The  left ventricle has normal  function. The left ventricle has no regional  wall motion abnormalities. Diastolic function indeterminant due to AFib.   2. Right ventricular systolic function is mildly reduced. The right  ventricular size is mildly enlarged. There is moderately elevated  pulmonary artery systolic pressure. The estimated right ventricular  systolic pressure is 50.0 mmHg.   3. Left atrial size was moderately dilated.   4. Right atrial size was moderately dilated.   5. The mitral valve is grossly normal. There is moderate-to-severe  functional mitral regurgitation.   6. Tricuspid valve regurgitation is moderate to severe. Appears to be  functional with poor central coaptation of the TV leaflets.   7. The aortic valve is tricuspid. There is mild calcification of the  aortic valve. There is mild thickening of the aortic valve. Aortic valve  regurgitation is trivial. Aortic valve sclerosis/calcification is present,  without any evidence of aortic  stenosis.   8. The inferior vena cava is dilated in size with <50% respiratory  variability, suggesting right atrial pressure of 15 mmHg.     LABORATORY DATA:    Latest Ref Rng & Units 02/04/2022    1:24 PM 01/04/2022    4:35 PM 12/18/2021    3:44 AM  CBC  WBC 4.0 - 10.5 K/uL   6.8   Hemoglobin 13.0 - 17.7 g/dL 01/06/2022  12/20/2021  40.9  Hematocrit 37.5 - 51.0 % 47.5  51.4  41.0   Platelets 150 - 400 K/uL   164        Latest Ref Rng & Units 02/04/2022    1:24 PM 01/04/2022    4:35 PM 12/18/2021    3:44 AM  CMP  Glucose 70 - 99 mg/dL 007  622  633   BUN 8 - 27 mg/dL 16  15  30    Creatinine 0.76 - 1.27 mg/dL  3.54  5.62   Sodium 134 - 144 mmol/L 140  138  135   Potassium 3.5 - 5.2 mmol/L 4.7  4.4  3.7   Chloride 96 - 106 mmol/L 101  97  101   CO2 20 - 29 mmol/L 18  24  25    Calcium 8.6 - 10.2 mg/dL 9.5  9.9  8.8     Lipid Panel     Component Value Date/Time   CHOL 84 12/15/2021 1855   TRIG 35 12/15/2021 1855   HDL 26 (L) 12/15/2021 1855   CHOLHDL  3.2 12/15/2021 1855   VLDL 7 12/15/2021 1855   LDLCALC 51 12/15/2021 1855   LDLDIRECT 50.1 12/15/2021 1851    No components found for: "NTPROBNP" Recent Labs    02/04/22 1324  PROBNP 766*   Recent Labs    12/14/21 1425  TSH <0.010*    BMP Recent Labs    12/16/21 0249 12/17/21 0259 12/18/21 0344 01/04/22 1635 02/04/22 1324  NA 140 135 135 138 140  K 4.3 4.1 3.7 4.4 4.7  CL 113* 105 101 97 101  CO2 21* 19* 25 24 18*  GLUCOSE 111* 88 106* 102* 239*  BUN 14 22 30* 15 16  CREATININE 0.64 0.73 0.61 0.87 0.65*  CALCIUM 8.7* 8.7* 8.8* 9.9 9.5  GFRNONAA >60 >60 >60  --   --     HEMOGLOBIN A1C Lab Results  Component Value Date   HGBA1C 6.5 (H) 12/15/2021   MPG 139.85 12/15/2021    IMPRESSION:    ICD-10-CM   1. Chronic heart failure with preserved ejection fraction (HCC)  I50.32 EKG 12-Lead    atenolol (TENORMIN) 25 MG tablet       RECOMMENDATIONS: Gary Ryan is a 66 y.o. Gary Ryan male whose past medical history and cardiac risk factors include: Non-insulin-dependent diabetes mellitus type 2, hypothyroidism, paroxysmal atrial fibrillation requiring TEE guided cardioversion May 2023, former smoker.  Chronic heart failure with preserved ejection fraction (HCC) Euvolemic on physical examination. Weight remains relatively stable since last office visit. Due to soft blood pressures well decrease Entresto from 49/51 mg p.o. twice daily to 24/26 mg p.o. twice daily Patient is also asked to use Lasix on a as needed basis until he is back to his dry weight. Discontinue digoxin. Hospital discharge weight 45.5 kg.  Outside labs from June 2023 independently reviewed and noted above for further reference. Cardiac MRI still pending. Reemphasized importance of strict I's and O's and daily weights.  Paroxysmal A-fib (HCC) Rate control: Atenolol. Rhythm control: N/A. Thromboembolic prophylaxis: Eliquis.  CHA2DS2-VASc SCORE is 2 which correlates to 2 % risk of stroke  per year (CHF, diabetes). EKG re-illustrates sinus bradycardia. Discontinue digoxin.  Underwent TEE guided cardioversion Dec 17, 2021.  Long term (current) use of anticoagulants Indication paroxysmal atrial fibrillation. Does not endorse evidence of bleeding. Risks, benefits, and alternatives to oral anticoagulation discussed with the patient and his son at today's office visit.  Type 2 diabetes mellitus with hyperglycemia, without long-term  current use of insulin (Gary Ryan) Reemphasized importance of glycemic control. Currently managed by primary care provider.  Hyperthyroidism Currently on methimazole and atenolol. Patient has an appointment with endocrinology in September 2023.   I have asked him to also reach out to Dr. Chalmers Cater to see if he can be seen sooner.  Bone disease During his past hospitalization he had a CT scan in the ED which noted nonspecific diffusely heterogenous bone disease. He was recommended to follow-up with hematology oncology for further evaluation and management, this is still pending.  Results of the SPEP and UPEP reviewed. No significant M spike. Kappa free light chains elevated but kappa to lambda ratio within normal limits.  His son also served as an Astronomer as the patient predominately speaks [place].  He can be reached at [phone].  FINAL MEDICATION LIST END OF ENCOUNTER: Meds ordered this encounter  Medications  . sacubitril-valsartan (ENTRESTO) 24-26 MG    Sig: Take 1 tablet by mouth 2 (two) times daily.    Dispense:  180 tablet    Refill:  0  . atenolol (TENORMIN) 25 MG tablet    Sig: Take 1 tablet (25 mg total) by mouth daily.    Dispense:  180 tablet    Refill:  0    Medications Discontinued During This Encounter  Medication Reason  . metFORMIN (GLUCOPHAGE) 098 MG tablet Duplicate  . methimazole (TAPAZOLE) 5 MG tablet Duplicate  . ENTRESTO 49-51 MG Dose change  . atenolol (TENORMIN) 25 MG tablet      Current Outpatient  Medications:  .  apixaban (ELIQUIS) 5 MG TABS tablet, Take 1 tablet (5 mg total) by mouth 2 (two) times daily., Disp: 180 tablet, Rfl: 1 .  Cholecalciferol (D3 2000 PO), Take by mouth., Disp: , Rfl:  .  dapagliflozin propanediol (FARXIGA) 10 MG TABS tablet, Take 1 tablet (10 mg total) by mouth daily., Disp: 90 tablet, Rfl: 1 .  digoxin (LANOXIN) 0.125 MG tablet, Take 125 mcg by mouth daily., Disp: , Rfl:  .  furosemide (LASIX) 20 MG tablet, Take 1 tablet (20 mg total) by mouth daily as needed for fluid or edema., Disp: 90 tablet, Rfl: 1 .  metFORMIN (GLUCOPHAGE) 500 MG tablet, Take 1 tablet by mouth daily with breakfast., Disp: , Rfl:  .  sacubitril-valsartan (ENTRESTO) 24-26 MG, Take 1 tablet by mouth 2 (two) times daily., Disp: 180 tablet, Rfl: 0 .  atenolol (TENORMIN) 25 MG tablet, Take 1 tablet (25 mg total) by mouth daily., Disp: 180 tablet, Rfl: 0 .  methimazole (TAPAZOLE) 10 MG tablet, Take 10 mg by mouth 2 (two) times daily., Disp: , Rfl:   Orders Placed This Encounter  Procedures  . EKG 12-Lead    There are no Patient Instructions on file for this visit.   --Continue cardiac medications as reconciled in final medication list. --Return in about 3 months (around 09/03/2022) for Follow up, heart failure management.. or sooner if needed. --Continue follow-up with your primary care physician regarding the management of your other chronic comorbid conditions.  Patient's questions and concerns were addressed to his satisfaction. He voices understanding of the instructions provided during this encounter.   This note was created using a voice recognition software as a result there may be grammatical errors inadvertently enclosed that do not reflect the nature of this encounter. Every attempt is made to correct such errors.  Rex Kras, [place], [facility]  Pager: [phone] Office: [phone]

## 2022-06-09 ENCOUNTER — Encounter: Payer: Self-pay | Admitting: Cardiology

## 2022-06-30 ENCOUNTER — Other Ambulatory Visit: Payer: Self-pay | Admitting: Cardiology

## 2022-06-30 DIAGNOSIS — I5032 Chronic diastolic (congestive) heart failure: Secondary | ICD-10-CM

## 2022-07-17 ENCOUNTER — Other Ambulatory Visit: Payer: Self-pay | Admitting: Cardiology

## 2022-09-09 ENCOUNTER — Encounter: Payer: Self-pay | Admitting: Cardiology

## 2022-09-09 ENCOUNTER — Ambulatory Visit: Payer: Commercial Managed Care - PPO | Admitting: Cardiology

## 2022-09-09 VITALS — BP 102/63 | HR 60 | Resp 16 | Ht 64.0 in | Wt 119.4 lb

## 2022-09-09 DIAGNOSIS — I5032 Chronic diastolic (congestive) heart failure: Secondary | ICD-10-CM

## 2022-09-09 DIAGNOSIS — E1165 Type 2 diabetes mellitus with hyperglycemia: Secondary | ICD-10-CM

## 2022-09-09 DIAGNOSIS — E0590 Thyrotoxicosis, unspecified without thyrotoxic crisis or storm: Secondary | ICD-10-CM

## 2022-09-09 DIAGNOSIS — I480 Paroxysmal atrial fibrillation: Secondary | ICD-10-CM

## 2022-09-09 DIAGNOSIS — Z7901 Long term (current) use of anticoagulants: Secondary | ICD-10-CM

## 2022-09-09 DIAGNOSIS — M899 Disorder of bone, unspecified: Secondary | ICD-10-CM

## 2022-09-09 MED ORDER — ENTRESTO 24-26 MG PO TABS: 1.0000 | ORAL_TABLET | Freq: Two times a day (BID) | ORAL | 0 refills | Status: DC

## 2022-09-09 MED ORDER — DAPAGLIFLOZIN PROPANEDIOL 10 MG PO TABS: 10.0000 mg | ORAL_TABLET | Freq: Every day | ORAL | 1 refills | Status: DC

## 2022-09-09 NOTE — Progress Notes (Signed)
ID:  Agamjot Kilgallon, DOB [birth year], MRN [id number]  PCP:  Vickii Penna, MD  Cardiologist:  Rex Kras, DO, [facility] (established care Dec 15, 2021)  Date: 09/09/22 Last Office Visit: 06/03/2022  Chief Complaint  Patient presents with  . Congestive Heart Failure    management  . Follow-up    3 months    HPI  Gary Ryan is a 67 y.o. [place] male whose past medical history and cardiovascular risk factors include: Non-insulin-dependent diabetes mellitus type 2, hyperthyroidism, paroxysmal atrial fibrillation requiring TEE guided cardioversion May 2023, former smoker.  In May 2023 he was hospitalized and cardiology was asked to manage his A-fib with RVR.  He was also noted to be in hyperthyroidism as well as congestive heart failure.  He was started on parenteral medication but remains in A-fib with RVR and therefore underwent TEE guided cardioversion.  Since then he has been in sinus rhythm and tolerating AV nodal blocking agents for rate control strategy and anticoagulation for thromboembolic prophylaxis.  Uptitration of GDMT has been difficult due to feeling tired/fatigued, hypotension.  Since last office visit patient states that he is back working as much as he can.  His biggest concerns are feeling tired/fatigued.  His blood pressures are currently soft.  He states that his home blood pressures are consistently <110 mmHg.  He is currently being treated for hyperthyroidism; however, states that his goiter has been getting bigger since September 2023.  During his hospitalization there is concern for possible bone disease and he still has not followed up with hematology oncology for reasons unknown.  Of note patient is a poor historian as he predominately speaks [place].  This encounter was performed in the presence of [employer] interpretation services who provided a [place].  [employer] translation services  Dizzy and fatigue has improved.  Bumps in legs - feels  them  No SOB, orthopena,  No entresto - for one month "I didn't get a refill"    ALLERGIES: No Known Allergies  MEDICATION LIST PRIOR TO VISIT: Current Meds  Medication Sig  . atenolol (TENORMIN) 25 MG tablet Take 25 mg by mouth 2 (two) times daily.  . Cholecalciferol (D3 2000 PO) Take by mouth.  Arne [place] 5 MG TABS tablet TAKE 1 TABLET(5 MG) BY MOUTH TWICE DAILY  . metFORMIN (GLUCOPHAGE) 500 MG tablet Take 1 tablet by mouth daily with breakfast.  . methimazole (TAPAZOLE) 10 MG tablet Take 10 mg by mouth 2 (two) times daily.  . sacubitril-valsartan (ENTRESTO) 24-26 MG Take 1 tablet by mouth 2 (two) times daily.  . [DISCONTINUED] atenolol (TENORMIN) 25 MG tablet TAKE 1 TABLET(25 MG) BY MOUTH TWICE DAILY  . [DISCONTINUED] dapagliflozin propanediol (FARXIGA) 10 MG TABS tablet Take 1 tablet (10 mg total) by mouth daily.  . [DISCONTINUED] sacubitril-valsartan (ENTRESTO) 49-51 MG Take 1 tablet by mouth 2 (two) times daily.     PAST MEDICAL HISTORY: Past Medical History:  Diagnosis Date  . (HFpEF) heart failure with preserved ejection fraction ([place])   . Arthritis   . Atrial fibrillation ([place])   . Diabetes mellitus without complication ([place])   . GERD (gastroesophageal reflux disease)   . Hyperthyroidism     PAST SURGICAL HISTORY: Past Surgical History:  Procedure Laterality Date  . BUBBLE STUDY  12/17/2021   Procedure: BUBBLE STUDY;  Surgeon: Rex Kras, DO;  Location: [place];  Service: Cardiovascular;;  . CARDIOVERSION N/A 12/17/2021   Procedure: CARDIOVERSION;  Surgeon: Rex Kras, DO;  Location: [place];  Service: Cardiovascular;  Laterality: N/A;  . TEE WITHOUT CARDIOVERSION N/A 12/17/2021   Procedure: TRANSESOPHAGEAL ECHOCARDIOGRAM (TEE);  Surgeon: Rex Kras, DO;  Location: [facility];  Service: Cardiovascular;  Laterality: N/A;    FAMILY HISTORY: No family history of premature coronary disease or sudden cardiac death.  SOCIAL HISTORY:  The patient   reports that he has quit smoking. His smoking use included cigarettes. He has never used smokeless tobacco. He reports current alcohol use. He reports that he does not use drugs.  REVIEW OF SYSTEMS: Review of Systems  Cardiovascular:  Negative for chest pain, dyspnea on exertion, leg swelling, near-syncope, orthopnea, palpitations, paroxysmal nocturnal dyspnea and syncope.  Respiratory:  Positive for cough and shortness of breath (improved.).   Musculoskeletal:        Right shoulder pain    PHYSICAL EXAM:    09/09/2022    2:30 PM 06/03/2022    2:22 PM 02/25/2022    1:46 PM  Vitals with BMI  Height 5\' 4"  5\' 4"  5\' 4"   Weight 119 lbs 6 oz 119 lbs 10 oz 107 lbs  BMI 20.48 95.18 84.16  Systolic 606 93 96  Diastolic 63 50 57  Pulse 60 45 54    CONSTITUTIONAL: Appears older than stated age, speaks in [place], hemodynamically stable, no acute distress.  SKIN: Skin is warm and dry. No rash noted. No cyanosis. No pallor. No jaundice HEAD: Normocephalic and atraumatic.  EYES: No scleral icterus MOUTH/THROAT: Moist oral membranes.  NECK: No JVP present.  Goiter noted.  Bilateral carotid bruits right greater than left. CHEST Normal respiratory effort. No intercostal retractions  LUNGS: Clear to auscultation bilaterally.  No stridor. No wheezes. No rales.  CARDIOVASCULAR: Regular, bradycardic, positive S1-S2, no murmurs rubs or gallops appreciated. ABDOMINAL: Soft, nontender, nondistended, positive bowel sounds in all 4 quadrants, no apparent ascites.  EXTREMITIES: No peripheral edema, warm to touch, 2+ bilateral DP and PT pulses HEMATOLOGIC: No significant bruising NEUROLOGIC: Oriented to person, place, and time. Nonfocal. Normal muscle tone.  PSYCHIATRIC: Normal mood and affect. Normal behavior. Cooperative  CARDIAC DATABASE: EKG: 01/04/2022: Normal sinus rhythm, 68 bpm, normal axis, without underlying injury pattern. 06/03/2022: Sinus bradycardia, 40 bpm, nonspecific T wave abnormality,  without underlying injury pattern. 09/09/2022: Sinus Bradycardia, 58bpm, without underlying injury pattern.   Echocardiogram: 12/15/2021:  1. Left ventricular ejection fraction, by estimation, is 60 to 65%. The  left ventricle has normal function. The left ventricle has no regional  wall motion abnormalities. Diastolic function indeterminant due to AFib.   2. Right ventricular systolic function is mildly reduced. The right  ventricular size is mildly enlarged. There is moderately elevated  pulmonary artery systolic pressure. The estimated right ventricular  systolic pressure is 30.1 mmHg.   3. Left atrial size was moderately dilated.   4. Right atrial size was moderately dilated.   5. The mitral valve is grossly normal. There is moderate-to-severe  functional mitral regurgitation.   6. Tricuspid valve regurgitation is moderate to severe. Appears to be  functional with poor central coaptation of the TV leaflets.   7. The aortic valve is tricuspid. There is mild calcification of the  aortic valve. There is mild thickening of the aortic valve. Aortic valve  regurgitation is trivial. Aortic valve sclerosis/calcification is present,  without any evidence of aortic  stenosis.   8. The inferior vena cava is dilated in size with <50% respiratory  variability, suggesting right atrial pressure of 15 mmHg.     LABORATORY DATA:  Latest Ref Rng & Units 02/04/2022    1:24 PM 01/04/2022    4:35 PM 12/18/2021    3:44 AM  CBC  WBC 4.0 - 10.5 K/uL   6.8   Hemoglobin 13.0 - 17.7 g/dL 77.8  24.2  35.3   Hematocrit 37.5 - 51.0 % 47.5  51.4  41.0   Platelets 150 - 400 K/uL   164        Latest Ref Rng & Units 02/04/2022    1:24 PM 01/04/2022    4:35 PM 12/18/2021    3:44 AM  CMP  Glucose 70 - 99 mg/dL 614  431  540   BUN 8 - 27 mg/dL 16  15  30    Creatinine 0.76 - 1.27 mg/dL  0.86  7.61   Sodium 134 - 144 mmol/L 140  138  135   Potassium 3.5 - 5.2 mmol/L 4.7  4.4  3.7   Chloride 96 - 106  mmol/L 101  97  101   CO2 20 - 29 mmol/L 18  24  25    Calcium 8.6 - 10.2 mg/dL 9.5  9.9  8.8     Lipid Panel     Component Value Date/Time   CHOL 84 12/15/2021 1855   TRIG 35 12/15/2021 1855   HDL 26 (L) 12/15/2021 1855   CHOLHDL 3.2 12/15/2021 1855   VLDL 7 12/15/2021 1855   LDLCALC 51 12/15/2021 1855   LDLDIRECT 50.1 12/15/2021 1851    No components found for: "NTPROBNP" Recent Labs    02/04/22 1324  PROBNP 766*   Recent Labs    12/14/21 1425  TSH <0.010*    BMP Recent Labs    12/16/21 0249 12/17/21 0259 12/18/21 0344 01/04/22 1635 02/04/22 1324  NA 140 135 135 138 140  K 4.3 4.1 3.7 4.4 4.7  CL 113* 105 101 97 101  CO2 21* 19* 25 24 18*  GLUCOSE 111* 88 106* 102* 239*  BUN 14 22 30* 15 16  CREATININE 0.64 0.73 0.61 0.87 0.65*  CALCIUM 8.7* 8.7* 8.8* 9.9 9.5  GFRNONAA >60 >60 >60  --   --     HEMOGLOBIN A1C Lab Results  Component Value Date   HGBA1C 6.5 (H) 12/15/2021   MPG 139.85 12/15/2021    IMPRESSION:    ICD-10-CM   1. Chronic heart failure with preserved ejection fraction (HCC)  I50.32 EKG 12-Lead    sacubitril-valsartan (ENTRESTO) 24-26 MG    dapagliflozin propanediol (FARXIGA) 10 MG TABS tablet    2. Paroxysmal A-fib (HCC)  I48.0 EKG 12-Lead    3. Long term (current) use of anticoagulants  Z79.01     4. Type 2 diabetes mellitus with hyperglycemia, without long-term current use of insulin (HCC)  E11.65     5. Hyperthyroidism  E05.90     6. Bone disease  M89.9        RECOMMENDATIONS: Gary Ryan is a 67 y.o. [place] male whose past medical history and cardiac risk factors include: Non-insulin-dependent diabetes mellitus type 2, hypothyroidism, paroxysmal atrial fibrillation requiring TEE guided cardioversion May 2023, former smoker.  Chronic heart failure with preserved ejection fraction (HCC) Stage C, NYHA class II/III Weight gain of 12 pounds since last visit. Hypotensive/tired/fatigued unable to uptitrate diuretics for  now. Decrease atenolol from 50 mg p.o. daily to 25 mg p.o. daily. Decrease Entresto 49/51 mg p.o. twice daily to 24/26 mg p.o. twice daily Hospital discharge weight 45.5 kg.  Outside labs in 1 week to  reevaluate kidney function, NT proBNP. Cardiac MRI still pending. Reemphasized importance of strict I's and O's and daily weights.  Paroxysmal A-fib (HCC) Rate control: Atenolol. Rhythm control: N/A. Thromboembolic prophylaxis: Eliquis.  CHA2DS2-VASc SCORE is 2 which correlates to 2 % risk of stroke per year (CHF, diabetes). Underwent TEE guided cardioversion Dec 17, 2021.  Long term (current) use of anticoagulants Indication paroxysmal atrial fibrillation. Does not endorse evidence of bleeding. Risks, benefits, and alternatives to oral anticoagulation discussed with the patient and his son at today's office visit.  Type 2 diabetes mellitus with hyperglycemia, without long-term current use of insulin ([place]) Reemphasized importance of glycemic control. Currently managed by primary care provider.  Hyperthyroidism Currently on methimazole and atenolol. Follows with endocrinology.  Bone disease During his past hospitalization he had a CT scan in the ED which noted nonspecific diffusely heterogenous bone disease. He was recommended to follow-up with hematology oncology for further evaluation and management, this is still pending.   FINAL MEDICATION LIST END OF ENCOUNTER: Meds ordered this encounter  Medications  . sacubitril-valsartan (ENTRESTO) 24-26 MG    Sig: Take 1 tablet by mouth 2 (two) times daily.    Dispense:  180 tablet    Refill:  0  . dapagliflozin propanediol (FARXIGA) 10 MG TABS tablet    Sig: Take 1 tablet (10 mg total) by mouth daily.    Dispense:  90 tablet    Refill:  1    Medications Discontinued During This Encounter  Medication Reason  . digoxin (LANOXIN) 0.125 MG tablet Discontinued by provider  . sacubitril-valsartan (ENTRESTO) 49-51 MG Change in therapy   . furosemide (LASIX) 20 MG tablet   . atenolol (TENORMIN) 25 MG tablet Dose change  . dapagliflozin propanediol (FARXIGA) 10 MG TABS tablet Reorder     Current Outpatient Medications:  .  atenolol (TENORMIN) 25 MG tablet, Take 25 mg by mouth 2 (two) times daily., Disp: , Rfl:  .  Cholecalciferol (D3 2000 PO), Take by mouth., Disp: , Rfl:  .  ELIQUIS 5 MG TABS tablet, TAKE 1 TABLET(5 MG) BY MOUTH TWICE DAILY, Disp: 180 tablet, Rfl: 1 .  metFORMIN (GLUCOPHAGE) 500 MG tablet, Take 1 tablet by mouth daily with breakfast., Disp: , Rfl:  .  methimazole (TAPAZOLE) 10 MG tablet, Take 10 mg by mouth 2 (two) times daily., Disp: , Rfl:  .  sacubitril-valsartan (ENTRESTO) 24-26 MG, Take 1 tablet by mouth 2 (two) times daily., Disp: 180 tablet, Rfl: 0 .  dapagliflozin propanediol (FARXIGA) 10 MG TABS tablet, Take 1 tablet (10 mg total) by mouth daily., Disp: 90 tablet, Rfl: 1  Orders Placed This Encounter  Procedures  . EKG 12-Lead    There are no Patient Instructions on file for this visit.   --Continue cardiac medications as reconciled in final medication list. --No follow-ups on file. or sooner if needed. --Continue follow-up with your primary care physician regarding the management of your other chronic comorbid conditions.  Patient's questions and concerns were addressed to his satisfaction. He voices understanding of the instructions provided during this encounter.   This note was created using a voice recognition software as a result there may be grammatical errors inadvertently enclosed that do not reflect the nature of this encounter. Every attempt is made to correct such errors.  Rex Kras, [place], [facility]  Pager: [phone] Office: [phone]

## 2022-09-11 ENCOUNTER — Encounter: Payer: Self-pay | Admitting: Cardiology

## 2022-11-10 ENCOUNTER — Other Ambulatory Visit: Payer: Self-pay | Admitting: Cardiology

## 2022-12-09 ENCOUNTER — Encounter: Payer: Self-pay | Admitting: Cardiology

## 2022-12-09 ENCOUNTER — Ambulatory Visit: Payer: Commercial Managed Care - PPO | Admitting: Cardiology

## 2022-12-09 VITALS — BP 110/75 | HR 57 | Resp 16 | Ht 64.0 in | Wt 110.2 lb

## 2022-12-09 DIAGNOSIS — Z7901 Long term (current) use of anticoagulants: Secondary | ICD-10-CM

## 2022-12-09 DIAGNOSIS — E119 Type 2 diabetes mellitus without complications: Secondary | ICD-10-CM

## 2022-12-09 DIAGNOSIS — E0590 Thyrotoxicosis, unspecified without thyrotoxic crisis or storm: Secondary | ICD-10-CM

## 2022-12-09 DIAGNOSIS — I340 Nonrheumatic mitral (valve) insufficiency: Secondary | ICD-10-CM

## 2022-12-09 DIAGNOSIS — I480 Paroxysmal atrial fibrillation: Secondary | ICD-10-CM

## 2022-12-09 DIAGNOSIS — I361 Nonrheumatic tricuspid (valve) insufficiency: Secondary | ICD-10-CM

## 2022-12-09 DIAGNOSIS — R0989 Other specified symptoms and signs involving the circulatory and respiratory systems: Secondary | ICD-10-CM

## 2022-12-09 NOTE — Progress Notes (Signed)
ID:  Gary Ryan, DOB 05-18-1956, MRN [id number]  PCP:  Arlan Organ, MD  Cardiologist:  Tessa Lerner, DO, [facility] (established care Dec 15, 2021)  Date: 12/09/22 Last Office Visit: 09/09/2022  Chief Complaint  Patient presents with   Follow-up    PAF     HPI  Gary Ryan is a 67 y.o. [place] male whose past medical history and cardiovascular risk factors include: Non-insulin-dependent diabetes mellitus type 2, hyperthyroidism, paroxysmal atrial fibrillation requiring TEE guided cardioversion May 2023, former smoker.  During his hospitalization in May 2023 cardiology was consulted for A-fib with RVR management in the setting of hyperthyroidism and symptoms consistent with congestive heart failure (HFpEF).  Patient underwent TEE guided cardioversion and since then has remained in sinus rhythm.  He is currently on AV nodal blocking agents and Eliquis for thromboembolic prophylaxis.  His atrial fibrillation episode back in May 2023 was likely precipitated by untreated hyperthyroidism.  We discussed considering loop implant for monitoring of A-fib burden and if no significant burden 9 days after implant could consider discontinuation of Eliquis to minimize risk of bleeding.  However, patient states that he is doing well on current medical therapy and would like to continue anticoagulation as opposed to proceeding with loop implant.  Given his hyperthyroidism currently follows with endocrinology at [facility] and his medications were recently titrated according to him.  Most recent TSH levels reviewed via Care Everywhere and noted below for further reference.  During the hospitalization he was recommended to follow-up with hematology oncology as outpatient to evaluate for pulm disease.  Patient states that this is still pending.  I will defer that to PCP for now.  AMS interpretation services were utilized to provide translation between [place] and [place] during today's  encounter the agent was [place] employee ID [id number].  ALLERGIES: No Known Allergies  MEDICATION LIST PRIOR TO VISIT: Current Meds  Medication Sig   atenolol (TENORMIN) 25 MG tablet TAKE 1 TABLET(25 MG) BY MOUTH TWICE DAILY   Cholecalciferol (D3 2000 PO) Take by mouth.   dapagliflozin propanediol (FARXIGA) 10 MG TABS tablet Take 1 tablet (10 mg total) by mouth daily.   ELIQUIS 5 MG TABS tablet TAKE 1 TABLET(5 MG) BY MOUTH TWICE DAILY   metFORMIN (GLUCOPHAGE) 500 MG tablet Take 1 tablet by mouth daily with breakfast.   methimazole (TAPAZOLE) 10 MG tablet Take 10 mg by mouth 2 (two) times daily.     PAST MEDICAL HISTORY: Past Medical History:  Diagnosis Date   (HFpEF) heart failure with preserved ejection fraction (HCC)    Arthritis    Atrial fibrillation (HCC)    CHF (congestive heart failure) (HCC)    Diabetes mellitus without complication (HCC)    GERD (gastroesophageal reflux disease)    Hyperthyroidism     PAST SURGICAL HISTORY: Past Surgical History:  Procedure Laterality Date   BUBBLE STUDY  12/17/2021   Procedure: BUBBLE STUDY;  Surgeon: Tessa Lerner, DO;  Location: [facility];  Service: Cardiovascular;;   CARDIOVERSION N/A 12/17/2021   Procedure: CARDIOVERSION;  Surgeon: Tessa Lerner, DO;  Location: [facility];  Service: Cardiovascular;  Laterality: N/A;   TEE WITHOUT CARDIOVERSION N/A 12/17/2021   Procedure: TRANSESOPHAGEAL ECHOCARDIOGRAM (TEE);  Surgeon: Tessa Lerner, DO;  Location: [facility];  Service: Cardiovascular;  Laterality: N/A;    FAMILY HISTORY: No family history of premature coronary disease or sudden cardiac death.  SOCIAL HISTORY:  The patient  reports that he has quit smoking. His smoking use included cigarettes.  He has never used smokeless tobacco. He reports current alcohol use. He reports that he does not use drugs.  REVIEW OF SYSTEMS: Review of Systems  Constitutional: Positive for malaise/fatigue.  Cardiovascular:  Negative for chest pain,  claudication, dyspnea on exertion, irregular heartbeat, leg swelling, near-syncope, orthopnea, palpitations, paroxysmal nocturnal dyspnea and syncope.  Respiratory:  Negative for shortness of breath.   Hematologic/Lymphatic: Negative for bleeding problem.  Musculoskeletal:  Negative for muscle cramps and myalgias.  Neurological:  Positive for dizziness and light-headedness.    PHYSICAL EXAM:    12/09/2022   11:28 AM 09/09/2022    2:30 PM 06/03/2022    2:22 PM  Vitals with BMI  Height 5\' 4"  5\' 4"  5\' 4"   Weight 110 lbs 3 oz 119 lbs 6 oz 119 lbs 10 oz  BMI 18.91 20.48 20.52  Systolic 110 102 93  Diastolic 75 63 50  Pulse 57 60 45   Orthostatic VS for the past 72 hrs (Last 3 readings):  Orthostatic BP Patient Position BP Location Cuff Size Orthostatic Pulse  12/09/22 1142 102/58 Standing Left Arm Small 56  12/09/22 1141 106/57 Sitting Left Arm Small 58  12/09/22 1138 107/55 Supine Left Arm Small 56   CONSTITUTIONAL: Appears older than stated age, speaks in [place] hemodynamically stable, no acute distress.   SKIN: Skin is warm and dry. No rash noted. No cyanosis. No pallor. No jaundice HEAD: Normocephalic and atraumatic.  EYES: No scleral icterus MOUTH/THROAT: Moist oral membranes, poor dentition.   NECK: No JVP present.  Goiter.  Bilateral carotid bruits right greater than left. CHEST Normal respiratory effort. No intercostal retractions  LUNGS: Clear to auscultation bilaterally.  No stridor. No wheezes. No rales.  CARDIOVASCULAR: Regular, bradycardic, positive S1-S2, holosystolic murmur at apex, no rubs or gallops appreciated. ABDOMINAL: Soft, nontender, nondistended, positive bowel sounds in all 4 quadrants, no apparent ascites.  EXTREMITIES: No peripheral edema, warm to touch. HEMATOLOGIC: No significant bruising NEUROLOGIC: Oriented to person, place, and time. Nonfocal. Normal muscle tone.  PSYCHIATRIC: Normal mood and affect. Normal behavior. Cooperative  CARDIAC  DATABASE: EKG: 01/04/2022: Normal sinus rhythm, 68 bpm, normal axis, without underlying injury pattern. 06/03/2022: Sinus bradycardia, 40 bpm, nonspecific T wave abnormality, without underlying injury pattern. 09/09/2022: Sinus Bradycardia, 58bpm, without underlying injury pattern.  Dec 09, 2022: Sinus bradycardia, 57 bpm, LAE, normal axis, TWI in the precordial leads, without underlying injury pattern.  Echocardiogram: 12/15/2021:  1. Left ventricular ejection fraction, by estimation, is 60 to 65%. The  left ventricle has normal function. The left ventricle has no regional  wall motion abnormalities. Diastolic function indeterminant due to AFib.   2. Right ventricular systolic function is mildly reduced. The right  ventricular size is mildly enlarged. There is moderately elevated  pulmonary artery systolic pressure. The estimated right ventricular  systolic pressure is 50.0 mmHg.   3. Left atrial size was moderately dilated.   4. Right atrial size was moderately dilated.   5. The mitral valve is grossly normal. There is moderate-to-severe  functional mitral regurgitation.   6. Tricuspid valve regurgitation is moderate to severe. Appears to be  functional with poor central coaptation of the TV leaflets.   7. The aortic valve is tricuspid. There is mild calcification of the  aortic valve. There is mild thickening of the aortic valve. Aortic valve  regurgitation is trivial. Aortic valve sclerosis/calcification is present,  without any evidence of aortic  stenosis.   8. The inferior vena cava is dilated in size with <50%  respiratory  variability, suggesting right atrial pressure of 15 mmHg.     LABORATORY DATA:    Latest Ref Rng & Units 02/04/2022    1:24 PM 01/04/2022    4:35 PM 12/18/2021    3:44 AM  CBC  WBC 4.0 - 10.5 K/uL   6.8   Hemoglobin 13.0 - 17.7 g/dL 16.1  09.6  04.5   Hematocrit 37.5 - 51.0 % 47.5  51.4  41.0   Platelets 150 - 400 K/uL   164        Latest Ref Rng &  Units 02/04/2022    1:24 PM 01/04/2022    4:35 PM 12/18/2021    3:44 AM  CMP  Glucose 70 - 99 mg/dL 409  811  914   BUN 8 - 27 mg/dL 16  15  30    Creatinine 0.76 - 1.27 mg/dL 7.82  9.56  2.13   Sodium 134 - 144 mmol/L 140  138  135   Potassium 3.5 - 5.2 mmol/L 4.7  4.4  3.7   Chloride 96 - 106 mmol/L 101  97  101   CO2 20 - 29 mmol/L 18  24  25    Calcium 8.6 - 10.2 mg/dL 9.5  9.9  8.8     Lipid Panel     Component Value Date/Time   CHOL 84 12/15/2021 1855   TRIG 35 12/15/2021 1855   HDL 26 (L) 12/15/2021 1855   CHOLHDL 3.2 12/15/2021 1855   VLDL 7 12/15/2021 1855   LDLCALC 51 12/15/2021 1855   LDLDIRECT 50.1 12/15/2021 1851    No components found for: "NTPROBNP" Recent Labs    02/04/22 1324  PROBNP 766*   Recent Labs    12/14/21 1425  TSH <0.010*    BMP Recent Labs    12/16/21 0249 12/17/21 0259 12/18/21 0344 01/04/22 1635 02/04/22 1324  NA 140 135 135 138 140  K 4.3 4.1 3.7 4.4 4.7  CL 113* 105 101 97 101  CO2 21* 19* 25 24 18*  GLUCOSE 111* 88 106* 102* 239*  BUN 14 22 30* 15 16  CREATININE 0.64 0.73 0.61 0.87 0.65*  CALCIUM 8.7* 8.7* 8.8* 9.9 9.5  GFRNONAA >60 >60 >60  --   --     HEMOGLOBIN A1C Lab Results  Component Value Date   HGBA1C 6.5 (H) 12/15/2021   MPG 139.85 12/15/2021   External Labs: Collected: 10/28/2022 available in Care Everywhere. Sodium 136, potassium 4.9, chloride 101, bicarb 26 eGFR >90. AST 13, ALT 14, alkaline phosphatase 217  Collected: 09/23/2022 available in Care Everywhere. A1c 5.6 Hemoglobin 15.7, hematocrit 47.4% Sodium 137, potassium 4.9, chloride 101, bicarb 27. BUN 16, creatinine 0.95. AST 22, ALT 18, alkaline phosphatase 248  IMPRESSION:    ICD-10-CM   1. Paroxysmal A-fib (HCC)  I48.0 EKG 12-Lead    ECHOCARDIOGRAM COMPLETE    2. Long term (current) use of anticoagulants  Z79.01     3. Bilateral carotid bruits  R09.89 PCV CAROTID DUPLEX (BILATERAL)    4. Type 2 diabetes mellitus with hyperglycemia,  without long-term current use of insulin (HCC)  E11.65     5. Hyperthyroidism  E05.90     6. Nonrheumatic tricuspid valve regurgitation  I36.1     7. Nonrheumatic mitral valve regurgitation  I34.0        RECOMMENDATIONS: Gary Ryan is a 67 y.o. [place] male whose past medical history and cardiac risk factors include: Non-insulin-dependent diabetes mellitus type 2, hypothyroidism, paroxysmal atrial fibrillation  requiring TEE guided cardioversion May 2023, former smoker.  Paroxysmal A-fib (HCC) Long term (current) use of anticoagulants Rate control: Atenolol. Rhythm control: N/A. Thromboembolic prophylaxis: Eliquis [id number] score 2 correlates to 2% risk of stroke per year (CHF and DM) Underwent TEE guided cardioversion on Dec 17, 2021. His A-fib was likely precipitated by uncontrolled hyperthyroidism.  Since cardioversion he has remained in sinus rhythm.  To prevent long-term oral anticoagulation we have discussed loop recorder implant for long-term monitoring.  After the planned if he does not have A-fib detected in 90 days we could consider discontinuation of anticoagulation until he has documented episode of A-fib again.  This will minimize the risk of bleeding with appropriate monitoring for A-fib.  However patient would like to hold off on loop implant and continue current medical therapy as has been tolerating it. Risks, benefits, alternatives to anticoagulation discussed he verbalizes understanding Most recent hemoglobin and renal function from February 2024 independently reviewed  Bilateral carotid bruits Carotid duplex to evaluate for source of bruit.  Non-insulin-dependent diabetes type 2 Most recent hemoglobin A1c has improved compared to the past. Currently on medical therapy. Currently managed by primary care provider.  Hyperthyroidism Currently on methimazole Follows with endocrinology, per patient  Nonrheumatic tricuspid valve regurgitation Nonrheumatic  mitral valve regurgitation Echo will be ordered to evaluate for structural heart disease and left ventricular systolic function.  Patient had symptoms consistent with HFpEF during his hospitalization in May 2023 most likely precipitated by untreated hyperthyroidism.  Uptitration of GDMT has been difficult due to medication noncompliance as well as episodes of lightheaded/dizziness.  Will hold off on further up titration of medical therapy at this time given his soft blood pressure readings, noncompliance, and likely his initial episode due to underlying thyroid disease.  Will repeat echocardiogram to reevaluate LVEF and diastology.  Further recommendations to follow.  Medication reconciliation has not been thoroughly performed by me as he does not have a list of his medications or bottles with him for reference.  Cardiac MRI has been requested in the past did not follow through on multiple occasions.  Currently it is cost prohibitive according to the patient and he would like to hold off.  FINAL MEDICATION LIST END OF ENCOUNTER: No orders of the defined types were placed in this encounter.   There are no discontinued medications.    Current Outpatient Medications:    atenolol (TENORMIN) 25 MG tablet, TAKE 1 TABLET(25 MG) BY MOUTH TWICE DAILY, Disp: 180 tablet, Rfl: 1   Cholecalciferol (D3 2000 PO), Take by mouth., Disp: , Rfl:    dapagliflozin propanediol (FARXIGA) 10 MG TABS tablet, Take 1 tablet (10 mg total) by mouth daily., Disp: 90 tablet, Rfl: 1   ELIQUIS 5 MG TABS tablet, TAKE 1 TABLET(5 MG) BY MOUTH TWICE DAILY, Disp: 180 tablet, Rfl: 1   metFORMIN (GLUCOPHAGE) 500 MG tablet, Take 1 tablet by mouth daily with breakfast., Disp: , Rfl:    methimazole (TAPAZOLE) 10 MG tablet, Take 10 mg by mouth 2 (two) times daily., Disp: , Rfl:   Orders Placed This Encounter  Procedures   EKG 12-Lead   ECHOCARDIOGRAM COMPLETE   PCV CAROTID DUPLEX (BILATERAL)    There are no Patient Instructions  on file for this visit.   --Continue cardiac medications as reconciled in final medication list. --Return in about 6 months (around 06/11/2023) for Follow up, A. fib. or sooner if needed. --Continue follow-up with your primary care physician regarding the management of your other chronic comorbid conditions.  Patient's questions and concerns were addressed to his satisfaction. He voices understanding of the instructions provided during this encounter.   This note was created using a voice recognition software as a result there may be grammatical errors inadvertently enclosed that do not reflect the nature of this encounter. Every attempt is made to correct such errors.  Tessa Lerner, [place], [facility]  Pager:  [phone] Office: [phone]

## 2022-12-30 ENCOUNTER — Ambulatory Visit (HOSPITAL_COMMUNITY): Admission: RE | Admit: 2022-12-30 | Payer: Commercial Managed Care - PPO | Source: Ambulatory Visit

## 2022-12-31 ENCOUNTER — Other Ambulatory Visit: Payer: Self-pay | Admitting: Cardiology

## 2022-12-31 DIAGNOSIS — I5032 Chronic diastolic (congestive) heart failure: Secondary | ICD-10-CM

## 2023-01-20 ENCOUNTER — Other Ambulatory Visit: Payer: Commercial Managed Care - PPO

## 2023-02-21 ENCOUNTER — Telehealth: Payer: Self-pay

## 2023-03-15 NOTE — Telephone Encounter (Signed)
03/15/23 - LVM to r/s echo & carotid

## 2023-05-03 ENCOUNTER — Encounter: Payer: Self-pay | Admitting: Cardiology

## 2023-05-25 ENCOUNTER — Other Ambulatory Visit: Payer: Self-pay

## 2023-05-25 DIAGNOSIS — I5032 Chronic diastolic (congestive) heart failure: Secondary | ICD-10-CM

## 2023-05-25 MED ORDER — DAPAGLIFLOZIN PROPANEDIOL 10 MG PO TABS
10.0000 mg | ORAL_TABLET | Freq: Every day | ORAL | 2 refills | Status: DC
Start: 2023-05-25 — End: 2023-07-14

## 2023-06-02 ENCOUNTER — Other Ambulatory Visit (HOSPITAL_COMMUNITY): Payer: Self-pay

## 2023-06-09 ENCOUNTER — Ambulatory Visit: Payer: Commercial Managed Care - PPO | Admitting: Cardiology

## 2023-06-09 ENCOUNTER — Ambulatory Visit: Payer: Commercial Managed Care - PPO | Admitting: Physician Assistant

## 2023-06-09 ENCOUNTER — Ambulatory Visit: Payer: Self-pay | Admitting: Cardiology

## 2023-07-13 NOTE — Progress Notes (Signed)
Cardiology Office Note:    Date:  07/14/2023  ID:  Gary Ryan, DOB 1955-10-26, MRN [id number] PCP: Arlan Organ, MD  [facility] Providers Cardiologist:  Tessa Lerner, DO       Patient Profile:      Paroxysmal atrial fibrillation  S/p TEE DCCV in 12/2021  AF likely precipitated by hyperthyroidism - pt opted to remain on anticoagulation  (HFpEF) heart failure with preserved ejection fraction  Admx 12/2021 w acute HF in setting of AF w RVR  Mitral regurgitation Tricuspid regurgitation  TTE 12/15/21: EF 60-65, no RWMA, mildly reduced RVSF, mild RVE, moderate pulm hypertension, RVSP 50, moderate BAE, moderate to severe TR, trivial AI, moderate to severe MR, RAP 15 TEE 12/17/21: EF 55-60, no RWMA, GLS -23.1, NL RVSF, mild LAE, no LAA clot, mild to mod MR, bubble study neg  Diabetes mellitus  Hyperthyroidism           History of Present Illness:   Gary Ryan is a 67 y.o. male who returns for follow up of CHF, AFib.   He was last seen by Dr. Odis Hollingshead in 12/2022. Follow up echocardiogram was recommended to follow up on MR, TR and assess diastolic function. Carotid US was also ordered b/c of a carotid bruit. Neither of these tests were done.   He is seen today with the help of an interpreter.  He notes that he is beginning "3% better."  He follows with endocrinology (Dr. Allena Katz) at [facility].  He had follow-up with primary care in early November.  Of note, TSH is now elevated at 19.369.  He remains on methimazole.  Dizziness has resolved but he remains fatigued.  He notes a pressure sensation in his chest that can occur at rest or with exertion.  He is able to exert himself without chest pain.  Drinking water does resolve his symptoms.  He has some difficulty swallowing at times.  He denies dysphagia.  Of note, he has a large goiter.  He has not had orthopnea, syncope or rapid heart rate.  He has minimal lower extremity edema.  Review of Systems  Gastrointestinal:   Negative for hematochezia and melena.  Genitourinary:  Negative for hematuria.  See HPI     Studies Reviewed:       Labs Reviewed through [facility] 06/09/23: TSH 19.369, K 4.4, SCr 0.96, ALT 15, BNP 93, Hgb 15.3        Risk Assessment/Calculations:    CHA2DS2-VASc Score = 3   This indicates a 3.2% annual risk of stroke. The patient's score is based upon: CHF History: 1 HTN History: 0 Diabetes History: 1 Stroke History: 0 Vascular Disease History: 0 Age Score: 1 Gender Score: 0            Physical Exam:   VS:  BP 120/76   Pulse 66   Ht 5\' 4"  (1.626 m)   Wt 127 lb 6.4 oz (57.8 kg)   SpO2 95%   BMI 21.87 kg/m    Wt Readings from Last 3 Encounters:  07/14/23 127 lb 6.4 oz (57.8 kg)  12/09/22 110 lb 3.2 oz (50 kg)  09/09/22 119 lb 6.4 oz (54.2 kg)    Constitutional:      Appearance: Healthy appearance. Not in distress.  Neck:     Thyroid: Thyromegaly present.     Vascular: JVD normal.  Pulmonary:     Breath sounds: Normal breath sounds. No wheezing. No rales.  Cardiovascular:  Normal rate. Regular rhythm.     Murmurs: There is a grade 2/6 systolic murmur at the LLSB.  Edema:    Peripheral edema absent.  Abdominal:     Palpations: Abdomen is soft.        Assessment and Plan:   Assessment & Plan Paroxysmal atrial fibrillation (HCC) Maintaining sinus rhythm on exam.  He has been off of Eliquis and atenolol for about a month.  He had difficulty getting these refilled.  His blood pressure and heart rate can tolerate reinitiation of beta-blocker therapy.  I think he should remain on beta-blocker therapy given the changes in his thyroid function with treatment of hyperthyroidism.  He is at risk to have recurrent atrial fibrillation. -Restart Eliquis 5 mg twice daily -DC atenolol -Start metoprolol succinate 25 mg daily -Follow-up 3 months Precordial chest pain His chest discomfort sounds noncardiac.  It has been ongoing for 2 years. He has symptoms that  can occur at rest or with exertion.  He can exert himself without symptoms.  He has some difficulty swallowing and does have a large goiter.  His symptoms improved with drinking water.  I suspect he may have some acid reflux contributing to his symptoms.  At this point, I do not think he needs an ischemic evaluation.   -Start omeprazole 20 mg daily for 4 weeks.  He can then take this as needed. Chronic heart failure with preserved ejection fraction (HCC) Heart failure occurred in the context of atrial fibrillation with RVR.  When he was hospitalized, he was placed on [place].  Volume status is currently stable.  He is NYHA II.   -Continue Entresto 24/26 mg twice daily.   -Restart Farxiga 10 mg daily. Hyperthyroidism Managed by endocrinology at [facility].  He is still on methimazole 10 mg twice daily.  Recent TSH was over 19.  He has not had a change in his medications since.  I have asked him to follow up with his endocrinologist. Nonrheumatic mitral valve regurgitation TTE in May 2023 with moderate to severe MR and moderate to severe TR.  MR was felt to be mild to moderate on TEE prior to his cardioversion.  He was to have a follow-up echocardiogram after last visit.  However, he was out of town and did not have this done. -Reschedule echocardiogram Bilateral carotid bruits He was to have carotid Dopplers done after last visit.  As noted, he was out of town and this was never done. -Reschedule carotid US      Dispo:  Return in about 3 months (around 10/12/2023) for w/ Dr. Odis Hollingshead.  Signed, Tereso Newcomer, PA-C

## 2023-07-14 ENCOUNTER — Encounter: Payer: Self-pay | Admitting: Physician Assistant

## 2023-07-14 ENCOUNTER — Ambulatory Visit: Payer: Commercial Managed Care - PPO | Attending: Physician Assistant | Admitting: Physician Assistant

## 2023-07-14 VITALS — BP 120/76 | HR 66 | Ht 64.0 in | Wt 127.4 lb

## 2023-07-14 DIAGNOSIS — E0590 Thyrotoxicosis, unspecified without thyrotoxic crisis or storm: Secondary | ICD-10-CM | POA: Diagnosis not present

## 2023-07-14 DIAGNOSIS — I340 Nonrheumatic mitral (valve) insufficiency: Secondary | ICD-10-CM | POA: Insufficient documentation

## 2023-07-14 DIAGNOSIS — R072 Precordial pain: Secondary | ICD-10-CM | POA: Diagnosis not present

## 2023-07-14 DIAGNOSIS — I5032 Chronic diastolic (congestive) heart failure: Secondary | ICD-10-CM

## 2023-07-14 DIAGNOSIS — R0989 Other specified symptoms and signs involving the circulatory and respiratory systems: Secondary | ICD-10-CM

## 2023-07-14 DIAGNOSIS — I480 Paroxysmal atrial fibrillation: Secondary | ICD-10-CM | POA: Diagnosis not present

## 2023-07-14 DIAGNOSIS — I361 Nonrheumatic tricuspid (valve) insufficiency: Secondary | ICD-10-CM | POA: Insufficient documentation

## 2023-07-14 MED ORDER — OMEPRAZOLE 20 MG PO CPDR: DELAYED_RELEASE_CAPSULE | ORAL | 2 refills | Status: DC

## 2023-07-14 MED ORDER — DAPAGLIFLOZIN PROPANEDIOL 10 MG PO TABS
10.0000 mg | ORAL_TABLET | Freq: Every day | ORAL | 3 refills | Status: DC
Start: 2023-07-14 — End: 2023-07-25

## 2023-07-14 MED ORDER — METOPROLOL SUCCINATE ER 25 MG PO TB24: 25.0000 mg | ORAL_TABLET | Freq: Every day | ORAL | 3 refills | Status: DC

## 2023-07-14 MED ORDER — APIXABAN 5 MG PO TABS: 5.0000 mg | ORAL_TABLET | Freq: Two times a day (BID) | ORAL | 3 refills | Status: AC

## 2023-07-14 NOTE — Assessment & Plan Note (Signed)
Heart failure occurred in the context of atrial fibrillation with RVR.  When he was hospitalized, he was placed on [place].  Volume status is currently stable.  He is NYHA II.   -Continue Entresto 24/26 mg twice daily.   -Restart Farxiga 10 mg daily.

## 2023-07-14 NOTE — Assessment & Plan Note (Signed)
Managed by endocrinology at [facility].  He is still on methimazole 10 mg twice daily.  Recent TSH was over 19.  He has not had a change in his medications since.  I have asked him to follow up with his endocrinologist.

## 2023-07-14 NOTE — Assessment & Plan Note (Signed)
TTE in May 2023 with moderate to severe MR and moderate to severe TR.  MR was felt to be mild to moderate on TEE prior to his cardioversion.  He was to have a follow-up echocardiogram after last visit.  However, he was out of town and did not have this done. -Reschedule echocardiogram

## 2023-07-14 NOTE — Assessment & Plan Note (Signed)
Maintaining sinus rhythm on exam.  He has been off of Eliquis and atenolol for about a month.  He had difficulty getting these refilled.  His blood pressure and heart rate can tolerate reinitiation of beta-blocker therapy.  I think he should remain on beta-blocker therapy given the changes in his thyroid function with treatment of hyperthyroidism.  He is at risk to have recurrent atrial fibrillation. -Restart Eliquis 5 mg twice daily -DC atenolol -Start metoprolol succinate 25 mg daily -Follow-up 3 months

## 2023-07-14 NOTE — Patient Instructions (Signed)
Medication Instructions:  Your physician has recommended you make the following change in your medication:   STOP Atenolol  START Toprol XL 25 taking 1 daily  START Eliquis 5 mg taking 1 twice a day  START Farxiga 10 mg taking 1 daily  START Omeprazole 20 mg taking 1 daily for 30 days, then take only as needed afterwards.  TAKE 30 Minutes prior to eating *If you need a refill on your cardiac medications before your next appointment, please call your pharmacy*   Lab Work: None ordered  If you have labs (blood work) drawn today and your tests are completely normal, you will receive your results only by: MyChart Message (if you have MyChart) OR A paper copy in the mail If you have any lab test that is abnormal or we need to change your treatment, we will call you to review the results.   Testing/Procedures: Your physician has requested that you have an echocardiogram. Echocardiography is a painless test that uses sound waves to create images of your heart. It provides your doctor with information about the size and shape of your heart and how well your heart's chambers and valves are working. This procedure takes approximately one hour. There are no restrictions for this procedure. Please do NOT wear cologne, perfume, aftershave, or lotions (deodorant is allowed). Please arrive 15 minutes prior to your appointment time.  Please note: We ask at that you not bring children with you during ultrasound (echo/ vascular) testing. Due to room size and safety concerns, children are not allowed in the ultrasound rooms during exams. Our front office staff cannot provide observation of children in our lobby area while testing is being conducted. An adult accompanying a patient to their appointment will only be allowed in the ultrasound room at the discretion of the ultrasound technician under special circumstances. We apologize for any inconvenience.   Your physician has requested that you have a carotid  duplex. This test is an ultrasound of the carotid arteries in your neck. It looks at blood flow through these arteries that supply the brain with blood. Allow one hour for this exam. There are no restrictions or special instructions.    Follow-Up: At [facility], you and your health needs are our priority.  As part of our continuing mission to provide you with exceptional heart care, we have created designated Provider Care Teams.  These Care Teams include your primary Cardiologist (physician) and Advanced Practice Providers (APPs -  Physician Assistants and Nurse Practitioners) who all work together to provide you with the care you need, when you need it.  We recommend signing up for the patient portal called "MyChart".  Sign up information is provided on this After Visit Summary.  MyChart is used to connect with patients for Virtual Visits (Telemedicine).  Patients are able to view lab/test results, encounter notes, upcoming appointments, etc.  Non-urgent messages can be sent to your provider as well.   To learn more about what you can do with MyChart, go to [email or web address].    Your next appointment:   3 month(s)  Provider:   Tessa Lerner, DO     Other Instructions Call your Endocrinologist to see if any meds needed to be adjusted.

## 2023-07-23 ENCOUNTER — Other Ambulatory Visit: Payer: Self-pay | Admitting: Cardiology

## 2023-07-23 DIAGNOSIS — I5032 Chronic diastolic (congestive) heart failure: Secondary | ICD-10-CM

## 2023-07-25 ENCOUNTER — Telehealth: Payer: Self-pay | Admitting: *Deleted

## 2023-07-25 NOTE — Telephone Encounter (Signed)
Covering Scott's inbox today. Per chart review this medicine was part of plan 07/14/23 to restart (had previously been on with stable labs), and 3 month f/u planned. No change to plan.

## 2023-07-25 NOTE — Telephone Encounter (Signed)
Received fax from [facility] regarding Farxiga.  When I called them, they told me to disregard, as it went through insurance.

## 2023-07-28 ENCOUNTER — Ambulatory Visit (HOSPITAL_COMMUNITY)
Admission: RE | Admit: 2023-07-28 | Discharge: 2023-07-28 | Disposition: A | Discharge status: Home / Self Care | Payer: Commercial Managed Care - PPO | Source: Ambulatory Visit | Attending: Physician Assistant | Admitting: Physician Assistant

## 2023-07-28 DIAGNOSIS — R0989 Other specified symptoms and signs involving the circulatory and respiratory systems: Secondary | ICD-10-CM | POA: Insufficient documentation

## 2023-08-16 ENCOUNTER — Telehealth: Payer: Self-pay | Admitting: Cardiology

## 2023-08-16 NOTE — Telephone Encounter (Signed)
 Son Anastasio Auerbach) called to follow-up on patient's test results.  Son stated staff should leave a voice message with results.

## 2023-08-16 NOTE — Telephone Encounter (Signed)
 Spoke with pt's son over the phone and went over the pt's carotid duplex results and note from [facility], [place]. Pt's son had no questions or concerns and verbalized understanding of information shared on the call.

## 2023-08-18 ENCOUNTER — Ambulatory Visit (HOSPITAL_COMMUNITY): Payer: 59 | Attending: Physician Assistant

## 2023-08-18 DIAGNOSIS — I361 Nonrheumatic tricuspid (valve) insufficiency: Secondary | ICD-10-CM | POA: Diagnosis present

## 2023-08-18 DIAGNOSIS — I5032 Chronic diastolic (congestive) heart failure: Secondary | ICD-10-CM | POA: Diagnosis not present

## 2023-08-18 DIAGNOSIS — R072 Precordial pain: Secondary | ICD-10-CM | POA: Diagnosis not present

## 2023-08-18 DIAGNOSIS — I480 Paroxysmal atrial fibrillation: Secondary | ICD-10-CM | POA: Diagnosis present

## 2023-08-18 DIAGNOSIS — I340 Nonrheumatic mitral (valve) insufficiency: Secondary | ICD-10-CM | POA: Insufficient documentation

## 2023-08-18 LAB — ECHOCARDIOGRAM COMPLETE
AR max vel: 2.96 cm2
AV Area VTI: 2.75 cm2
AV Area mean vel: 2.75 cm2
AV Mean grad: 10 mm[Hg]
AV Peak grad: 20.2 mm[Hg]
Ao pk vel: 2.25 m/s
Area-P 1/2: 3.48 cm2
S' Lateral: 2.9 cm

## 2023-10-20 ENCOUNTER — Ambulatory Visit: Payer: 59 | Attending: Cardiology | Admitting: Cardiology

## 2023-10-20 ENCOUNTER — Encounter: Payer: Self-pay | Admitting: Cardiology

## 2023-10-20 VITALS — BP 92/56 | HR 40 | Resp 16 | Ht 64.0 in | Wt 115.0 lb

## 2023-10-20 DIAGNOSIS — I480 Paroxysmal atrial fibrillation: Secondary | ICD-10-CM

## 2023-10-20 DIAGNOSIS — I5032 Chronic diastolic (congestive) heart failure: Secondary | ICD-10-CM | POA: Diagnosis not present

## 2023-10-20 DIAGNOSIS — E1165 Type 2 diabetes mellitus with hyperglycemia: Secondary | ICD-10-CM

## 2023-10-20 DIAGNOSIS — Z7901 Long term (current) use of anticoagulants: Secondary | ICD-10-CM

## 2023-10-20 DIAGNOSIS — E0590 Thyrotoxicosis, unspecified without thyrotoxic crisis or storm: Secondary | ICD-10-CM | POA: Diagnosis not present

## 2023-10-20 MED ORDER — LOSARTAN POTASSIUM 25 MG PO TABS: 12.5000 mg | ORAL_TABLET | Freq: Every evening | ORAL | 3 refills | Status: DC

## 2023-10-20 MED ORDER — LOSARTAN POTASSIUM 25 MG PO TABS
12.5000 mg | ORAL_TABLET | Freq: Every evening | ORAL | 3 refills | Status: DC
Start: 2023-10-20 — End: 2023-12-12

## 2023-10-20 NOTE — Patient Instructions (Signed)
 Medication Instructions:  Your physician has recommended you make the following change in your medication:   STOP Atenolol, Entresto, and Digoxin   START Losartan 12.5 mg once daily in the evening   *HOLD if top number of blood pressure is less than 110  *If you need a refill on your cardiac medications before your next appointment, please call your pharmacy*  Lab Work: To be completed today: hemoglobin/ hematocrit  To be completed in 1 week: BMP  If you have labs (blood work) drawn today and your tests are completely normal, you will receive your results only by: MyChart Message (if you have MyChart) OR A paper copy in the mail If you have any lab test that is abnormal or we need to change your treatment, we will call you to review the results.  Testing/Procedures: None ordered today.  Follow-Up: At [facility], you and your health needs are our priority.  As part of our continuing mission to provide you with exceptional heart care, we have created designated Provider Care Teams.  These Care Teams include your primary Cardiologist (physician) and Advanced Practice Providers (APPs -  Physician Assistants and Nurse Practitioners) who all work together to provide you with the care you need, when you need it.  We recommend signing up for the patient portal called "MyChart".  Sign up information is provided on this After Visit Summary.  MyChart is used to connect with patients for Virtual Visits (Telemedicine).  Patients are able to view lab/test results, encounter notes, upcoming appointments, etc.  Non-urgent messages can be sent to your provider as well.   To learn more about what you can do with MyChart, go to [email or web address].    Your next appointment:   3 month(s)  The format for your next appointment:   In Person  Provider:   Tereso Newcomer, PA-C. Then, [employer], DO will plan to see you again in 6 month(s).{  Other Instructions    1st Floor: - Lobby -  Registration  - Pharmacy  - Lab - Cafe  2nd Floor: - PV Lab - Diagnostic Testing (echo, CT, nuclear med)  3rd Floor: - Vacant  4th Floor: - TCTS (cardiothoracic surgery) - AFib Clinic - Structural Heart Clinic - Vascular Surgery  - Vascular Ultrasound  5th Floor: - HeartCare Cardiology (general and EP) - Clinical Pharmacy for coumadin, hypertension, lipid, weight-loss medications, and med management appointments    Valet parking services will be available as well.

## 2023-10-20 NOTE — Progress Notes (Signed)
 Cardiology Office Note:  .   Date:  10/20/2023  ID:  Gary Ryan, DOB 1955-09-03, MRN [id number] PCP:  Arlan Organ, MD  Former Cardiology Providers: NA [facility] Providers Cardiologist:  Tessa Lerner, DO , [facility] (established care 12/15/2021) Electrophysiologist:  None  Click to update primary MD,subspecialty MD or APP then REFRESH:1}    Chief Complaint  Patient presents with   Follow-up    Afib, HFpEF, Dizziness     History of Present Illness: .   Gary Ryan is a 68 y.o.  male whose past medical history and cardiovascular risk factors includes: Non-insulin-dependent diabetes mellitus type 2, hyperthyroidism, paroxysmal atrial fibrillation requiring TEE guided cardioversion May 2023, former smoker.   During his hospitalization in May 2023 cardiology was consulted for A-fib with RVR management in the setting of hyperthyroidism and acute/newly discovered HFpEF.  Patient underwent TEE guided cardioversion and since then had remained in sinus rhythm.  He has been on anticoagulation for thromboembolic prophylaxis.  Because initial episode of A-fib was likely secondary to hyperthyroidism wanted to avoid long-term anticoagulation due to risk/benefit ratio.  We discussed undergoing loop recorder implant for long-term monitoring for A-fib burden.  Status post loop implant if no A-fib is detected in the first 30 days anticoagulation can be discontinued until he has reoccurrence.  However, patient stated he wanted to continue anticoagulation knowing the risks versus benefit ratio then proceed forward loop implant.  Patient was following up with endocrinology at [facility].  Patient presents today for follow-up and is accompanied by a translator (Mr. Ferd Glassing).  Since last office visit patient was seen by Tereso Newcomer in December 2024 and restarted Eliquis for thromboembolic prophylaxis, atenolol was transitioned to metoprolol, restarted on Farxiga, ordered echo and carotid  duplex.  He also endorsed precordial pain which appeared to be noncardiac based on symptoms reassurance was provided and no additional workup was ordered.  The patient states that he has been experiencing dizziness for the last several months.  More pronounced with changing positions but can occur randomly.  Medication reconciliation is difficult to perform with the help of the interpreter it appears the patient is currently on Entresto 24/26 mg p.o. twice daily, atenolol 25 mg p.o. daily, digoxin 0.125 mg p.o. daily, Farxiga 10 mg p.o. daily, and furosemide 20 mg p.o. daily.  Please note medication reconciliation is difficult to perform due to language barrier and patient choosing not to bring his bottles with him at today's office visit.  Patient denies any near-syncope or syncopal events.  At last office visit when he saw Tereso Newcomer he was recommended to transition from metoprolol to atenolol; however, for reasons unknown he is still taking both.  Patient states that he has been noticing blood after a bowel movement on the tissue.  No frank hematochezia or melanotic stools.  No history of hemorrhoids.  He has not spoken to his primary care regarding this matter.  He also has an upcoming appointment with endocrinology in the coming weeks.  Review of Systems: .   Review of Systems  Eyes:  Positive for visual disturbance.  Cardiovascular:  Positive for palpitations. Negative for chest pain, claudication, irregular heartbeat, leg swelling, near-syncope, orthopnea, paroxysmal nocturnal dyspnea and syncope.  Respiratory:  Negative for shortness of breath.   Hematologic/Lymphatic: Negative for bleeding problem.  Gastrointestinal:  Positive for hematemesis (only seen on tissue when he wipes (not everyday)).  Neurological:  Positive for dizziness.    Studies Reviewed:   EKG: EKG  Interpretation Date/Time:  Friday October 20 2023 11:08:16 EDT Ventricular Rate:  40 PR Interval:    QRS  Duration:  86 QT Interval:  450 QTC Calculation: 366 R Axis:   71  Text Interpretation: Sinus bradycardia Minimal voltage criteria for LVH, may be normal variant ( Sokolow-Lyon ) When compared with ECG of 18-Dec-2021 13:31, Since last tracing rate slower Nonspecific T wave abnormality no longer evident in Inferior leads T wave inversion no longer evident in Anterior leads QT has shortened Confirmed by Tessa Lerner [phone]) on 10/20/2023 11:22:01 AM  Echocardiogram: January 2025: LVEF 65 to 70%. Normal diastolic function. Right ventricular size and function normal. No significant valvular heart disease. Estimated RAP 3 mmHg  Carotid Duplex  07/2023 Right Carotid: There was no evidence of thrombus, dissection, atherosclerotic plaque or stenosis in the cervical carotid system.   Left Carotid: There was no evidence of thrombus, dissection, atherosclerotic plaque or stenosis in the cervical carotid system.   Grossly enlarged hyperemic thyoid gland noted during exam.   Vertebrals:  Bilateral vertebral arteries demonstrate antegrade flow.  Subclavians: Normal flow hemodynamics were seen in bilateral subclavian arteries.    RADIOLOGY: NA  Risk Assessment/Calculations:   Click Here to Calculate/Change CHADS2VASc Score The patient's CHADS2-VASc score is 3, indicating a 3.2% annual risk of stroke.   CHF History: Yes HTN History: No Diabetes History: Yes Stroke History: No Vascular Disease History: No  Labs:       Latest Ref Rng & Units 02/04/2022    1:24 PM 01/04/2022    4:35 PM 12/18/2021    3:44 AM  CBC  WBC 4.0 - 10.5 K/uL   6.8   Hemoglobin 13.0 - 17.7 g/dL 60.4  54.0  98.1   Hematocrit 37.5 - 51.0 % 47.5  51.4  41.0   Platelets 150 - 400 K/uL   164        Latest Ref Rng & Units 02/04/2022    1:24 PM 01/04/2022    4:35 PM 12/18/2021    3:44 AM  BMP  Glucose 70 - 99 mg/dL 191  478  295   BUN 8 - 27 mg/dL 16  15  30    Creatinine 0.76 - 1.27 mg/dL 6.21  3.08  6.57    BUN/Creat Ratio 10 - 24 25  17     Sodium 134 - 144 mmol/L 140  138  135   Potassium 3.5 - 5.2 mmol/L 4.7  4.4  3.7   Chloride 96 - 106 mmol/L 101  97  101   CO2 20 - 29 mmol/L 18  24  25    Calcium 8.6 - 10.2 mg/dL 9.5  9.9  8.8       Latest Ref Rng & Units 02/04/2022    1:24 PM 01/04/2022    4:35 PM 12/18/2021    3:44 AM  CMP  Glucose 70 - 99 mg/dL 846  962  952   BUN 8 - 27 mg/dL 16  15  30    Creatinine 0.76 - 1.27 mg/dL 8.41  3.24  4.01   Sodium 134 - 144 mmol/L 140  138  135   Potassium 3.5 - 5.2 mmol/L 4.7  4.4  3.7   Chloride 96 - 106 mmol/L 101  97  101   CO2 20 - 29 mmol/L 18  24  25    Calcium 8.6 - 10.2 mg/dL 9.5  9.9  8.8     Lab Results  Component Value Date   CHOL 84 12/15/2021  HDL 26 (L) 12/15/2021   LDLCALC 51 12/15/2021   LDLDIRECT 50.1 12/15/2021   TRIG 35 12/15/2021   CHOLHDL 3.2 12/15/2021   No results for input(s): "LIPOA" in the last 8760 hours. No components found for: "NTPROBNP" No results for input(s): "PROBNP" in the last 8760 hours. No results for input(s): "TSH" in the last 8760 hours.  External Labs: Collected: 09/01/2023 [facility] A1c 6.6. Sodium 135, potassium 4.2, chloride 100, bicarb 27. BUN 14, creatinine 0.47. AST, ALT within normal limits. Alkaline phosphatase elevated  Collected: 08/25/2023 [facility] TSH <0.05, free T4 4, free T3 >20  Collected: 06/09/2023  [facility] Hb 15.3, Hct 16.5%  Physical Exam:    Today's Vitals   10/20/23 1106  BP: (!) 92/56  Pulse: (!) 40  Resp: 16  SpO2: 98%  Weight: 115 lb (52.2 kg)  Height: 5\' 4"  (1.626 m)   Body mass index is 19.74 kg/m. Wt Readings from Last 3 Encounters:  10/20/23 115 lb (52.2 kg)  07/14/23 127 lb 6.4 oz (57.8 kg)  12/09/22 110 lb 3.2 oz (50 kg)    Physical Exam  Constitutional: No distress.  hemodynamically stable  Neck: No JVD present. Thyromegaly present.  Cardiovascular: Normal rate, regular rhythm,  S1 normal and S2 normal. Exam reveals no gallop, no S3 and no S4.  No murmur heard. Pulmonary/Chest: Effort normal and breath sounds normal. No stridor. He has no wheezes. He has no rales.  Musculoskeletal:        General: No edema.     Cervical back: Neck supple.  Skin: Skin is warm.   Impression & Recommendation(s):  Impression:   ICD-10-CM   1. Paroxysmal atrial fibrillation (HCC)  I48.0 EKG 12-Lead    losartan (COZAAR) 25 MG tablet    DISCONTINUED: losartan (COZAAR) 25 MG tablet    2. Long term (current) use of anticoagulants  Z79.01 Hemoglobin and hematocrit, blood    Basic metabolic panel    Basic metabolic panel    Hemoglobin and hematocrit, blood    3. Chronic heart failure with preserved ejection fraction (HCC)  I50.32     4. Hyperthyroidism  E05.90     5. Type 2 diabetes mellitus with hyperglycemia, without long-term current use of insulin (HCC)  E11.65        Recommendation(s):  Paroxysmal atrial fibrillation (HCC) EKG today illustrates sinus bradycardia, marked Diagnosed back in 2023 in the setting of hyperparathyroidism leading to A-fib with RVR and heart failure Underwent TEE guided cardioversion on Dec 17, 2021. Patient in the past has verbalized that he would like to continue oral anticoagulation until his thyroid function is better controlled.  He understands that being on anticoagulation does place him at higher risk for bleeding when compared to his peers. In the past we have discussed considering ILR to monitor for Afib (but he denied today as well).  Today he is endorsing bright red blood when he wipes after bowel movement.  This is not consistent but noticeable.  Will check H&H.  If there is a significant drop in hemoglobin will need to hold off on anticoagulation.  However,  irrespectively he needs to talk to his PCP for further evaluation and management. For reasons unknown he is continuing to take both metoprolol and atenolol which is likely contributing to  his marked bradycardia on EKG. Patient is advised to stop atenolol 25 mg p.o. daily and to continue metoprolol.  Long term (current) use of anticoagulants See above.  Check H&H.  Chronic heart failure with preserved ejection fraction (HCC) Chronic and stable. Will discontinue Entresto due to hypotension. Will start losartan 12.5 mg p.o. every afternoon.  BMP in one week to check renal function and electrolytes. Discontinue digoxin 0.125 mg p.o. daily and atenolol 25 mg p.o. daily due to bradycardia Continue Farxiga 10 mg p.o. daily. Continue furosemide 20 mg p.o. daily Continue Toprol-XL 25 mg p.o. daily Echocardiogram from January 2025 illustrates preserved LVEF, no significant valvular heart disease, and estimated RAP 3 mmHg.  Hyperthyroidism Follows with endocrinology, Dr. Allena Katz at [facility].  I have reiterated the fact that his labs have illustrated higher than normal alkaline phosphatase levels.  Imaging back in 2023 illustrated concerns for possible bone disease this still needs to be evaluated.  I have asked him to discuss this further with PCP.  Discussed management of at least 2 chronic comorbid conditions, EKG ordered and independently reviewed, prescription drug management, additional labs ordered, the office visit was prolonged due to the need for medical interpretation services as patient predominately speaks [place]  Complex decision making given his comorbid conditions, poor follow-up, poor insight.  I reemphasized to the patient to bring his medications with him at every visit so that medication reconciliation is performed more accurately.  Orders Placed:  Orders Placed This Encounter  Procedures   Hemoglobin and hematocrit, blood    Standing Status:   Future    Number of Occurrences:   1    Expected Date:   10/20/2023    Expiration Date:   10/19/2024   Basic metabolic panel    Standing Status:   Future    Number of Occurrences:   1    Expected Date:    10/27/2023    Expiration Date:   10/19/2024   EKG 12-Lead     Final Medication List:    Meds ordered this encounter  Medications   DISCONTD: losartan (COZAAR) 25 MG tablet    Sig: Take 0.5 tablets (12.5 mg total) by mouth every evening. HOLD if systolic blood pressure (top number is less than 110).    Dispense:  30 tablet    Refill:  3   losartan (COZAAR) 25 MG tablet    Sig: Take 0.5 tablets (12.5 mg total) by mouth every evening. HOLD if systolic blood pressure (top number is less than 110).    Dispense:  30 tablet    Refill:  3    Medications Discontinued During This Encounter  Medication Reason   ENTRESTO 24-26 MG Discontinued by provider   atenolol (TENORMIN) 25 MG tablet Discontinued by provider   digoxin (LANOXIN) 0.125 MG tablet Discontinued by provider   losartan (COZAAR) 25 MG tablet      Current Outpatient Medications:    apixaban (ELIQUIS) 5 MG TABS tablet, Take 1 tablet (5 mg total) by mouth 2 (two) times daily., Disp: 180 tablet, Rfl: 3   Cholecalciferol (D3 2000 PO), Take by mouth., Disp: , Rfl:    FARXIGA 10 MG TABS tablet, TAKE 1 TABLET(10 MG) BY MOUTH DAILY, Disp: 90 tablet, Rfl: 3   metFORMIN (GLUCOPHAGE) 500 MG tablet, Take 1 tablet by mouth daily with breakfast., Disp: , Rfl:    methimazole (TAPAZOLE) 10 MG tablet, Take 10 mg by mouth 2 (two) times daily., Disp: , Rfl:    metoprolol succinate (TOPROL XL) 25 MG 24 hr tablet, Take 1 tablet (25 mg total) by mouth daily., Disp: 90 tablet, Rfl: 3   omeprazole (PRILOSEC) 20 MG capsule,  Take 1 tablet mouth daily for 30 days, then take only as needed.  TAKE 30 MINS PRIOR TO EATING, Disp: 30 capsule, Rfl: 2   furosemide (LASIX) 20 MG tablet, Take 20 mg by mouth daily., Disp: , Rfl:    losartan (COZAAR) 25 MG tablet, Take 0.5 tablets (12.5 mg total) by mouth every evening. HOLD if systolic blood pressure (top number is less than 110)., Disp: 30 tablet, Rfl: 3  Consent:   NA  Disposition:   3 months w / APP 6 months  w/ myself for Afib, HFpEF  His questions and concerns were addressed to his satisfaction. He voices understanding of the recommendations provided during this encounter.    Signed, Tessa Lerner, DO, [facility]  [facility]  [address] [place], [place] [address] 10/20/2023 12:52 PM

## 2023-10-27 LAB — HEMOGLOBIN AND HEMATOCRIT, BLOOD
Hematocrit: 48.7 % (ref 37.5–51.0)
Hemoglobin: 16.2 g/dL (ref 13.0–17.7)

## 2023-10-28 LAB — BASIC METABOLIC PANEL
BUN/Creatinine Ratio: 22 (ref 10–24)
BUN: 14 mg/dL (ref 8–27)
CO2: 21 mmol/L (ref 20–29)
Calcium: 9.4 mg/dL (ref 8.6–10.2)
Chloride: 99 mmol/L (ref 96–106)
Creatinine, Ser: 0.65 mg/dL — ABNORMAL LOW (ref 0.76–1.27)
Glucose: 120 mg/dL — ABNORMAL HIGH (ref 70–99)
Potassium: 4.3 mmol/L (ref 3.5–5.2)
Sodium: 138 mmol/L (ref 134–144)
eGFR: 104 mL/min/{1.73_m2} (ref >59)

## 2023-11-11 IMAGING — MR MR HEAD W/O CM
12 of 13 series · 44 of 48 positions shown · non-contrast
Comparison: Prior CT from earlier the same day.

CLINICAL DATA: Initial evaluation for acute TIA.

EXAM:
MRI HEAD WITHOUT CONTRAST
TECHNIQUE: Multiplanar, multiecho pulse sequences of the brain and surrounding
structures were obtained without intravenous contrast.

[Series 5: DWI · axial · 3.0mm · 0.88mm/px · z∈[-90,+61]mm · 8 of 112 slices shown (1 of 4)]
[im 1/112]
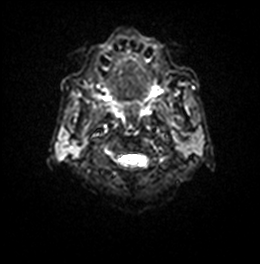
[im 16/112]
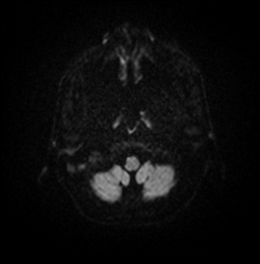
[im 32/112]
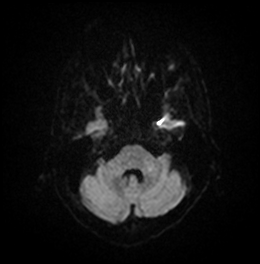
[im 48/112]
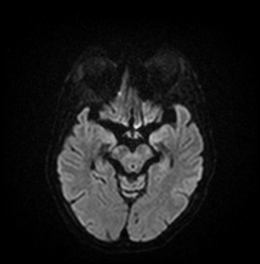
[im 64/112]
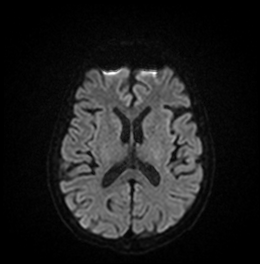
[im 80/112]
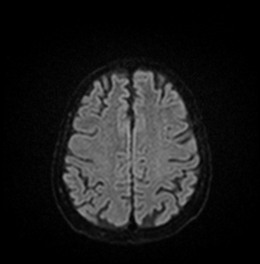
[im 96/112]
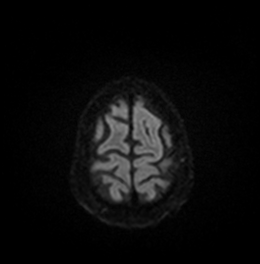
[im 112/112]
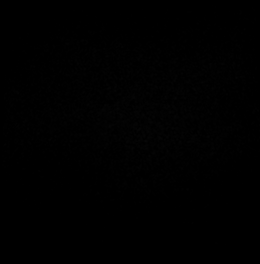

[Series 6: DWI · axial · 3.0mm · 0.88mm/px · z∈[-90,+58]mm · 4 of 55 slices shown (2 of 4)]
[im 1/55]
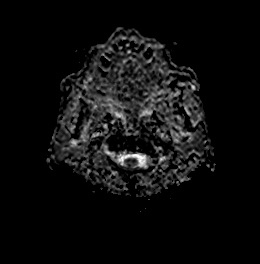
[im 19/55]
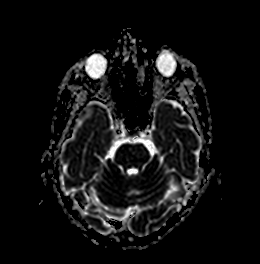
[im 37/55]
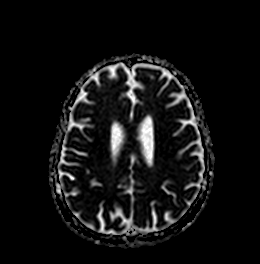
[im 55/55]
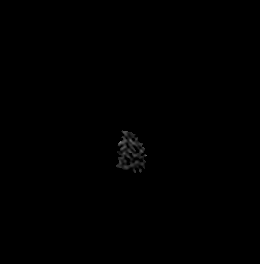

[Series 7: DWI · coronal · 4.0mm · 0.88mm/px · 5 of 76 slices shown (3 of 4)]
[im 1/76]
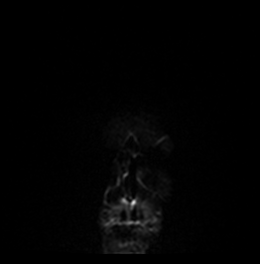
[im 19/76]
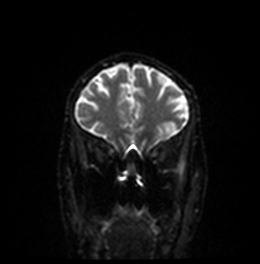
[im 38/76]
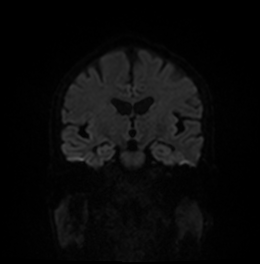
[im 57/76]
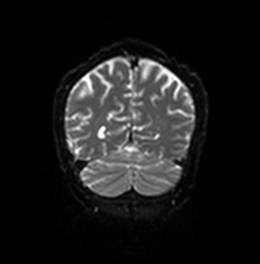
[im 76/76]
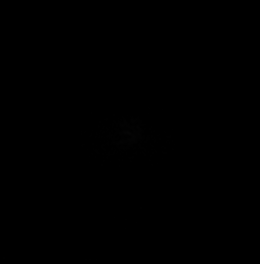

[Series 8: DWI · coronal · 4.0mm · 0.88mm/px · 3 of 38 slices shown (4 of 4)]
[im 1/38]
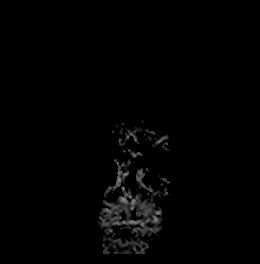
[im 19/38]
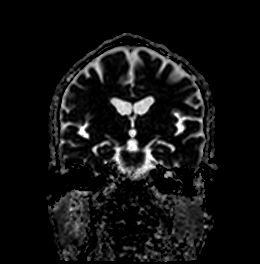
[im 38/38]
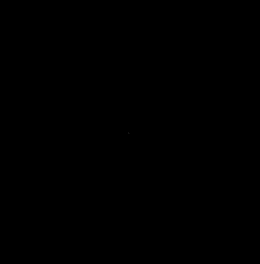

[Series 9: T1 · sagittal · 5.0mm · 0.75mm/px · 2 of 27 slices shown]
[im 1/27]
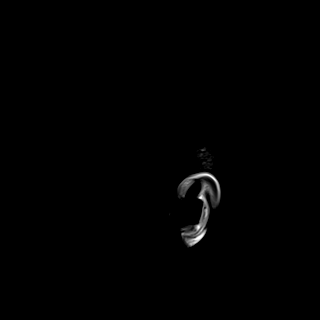
[im 27/27]
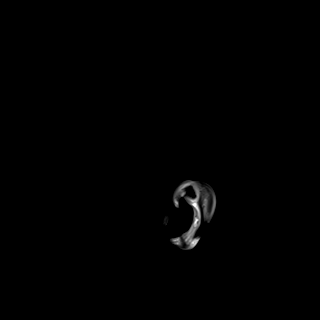

[Series 10: T2 · axial · 5.0mm · 0.72mm/px · z∈[-82,+65]mm · 2 of 28 slices shown (1 of 2)]
[im 1/28]
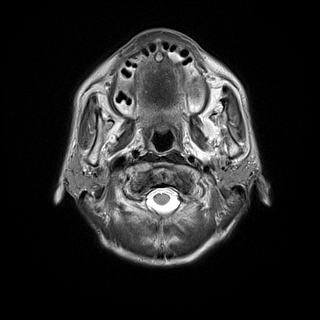
[im 28/28]
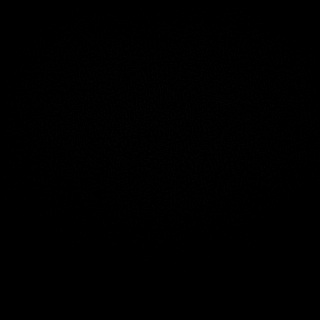

[Series 11: FLAIR · axial · 5.0mm · 0.45mm/px · z∈[-80,+67]mm · 2 of 28 slices shown]
[im 1/28]
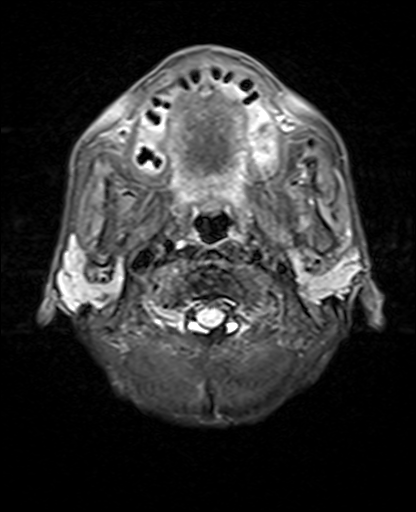
[im 28/28]
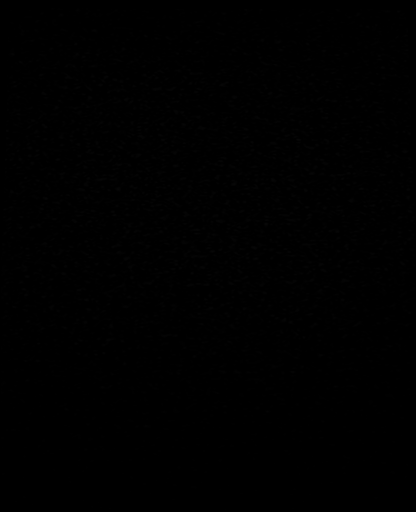

[Series 12: mag_images · axial · 3.0mm · 0.90mm/px · z∈[-84,+67]mm · 4 of 56 slices shown]
[im 1/56]
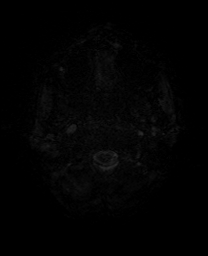
[im 19/56]
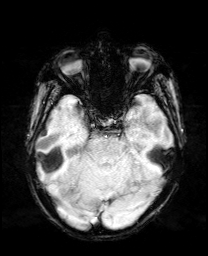
[im 37/56]
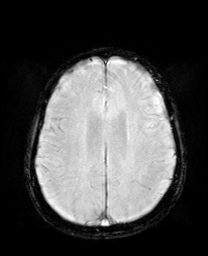
[im 56/56]
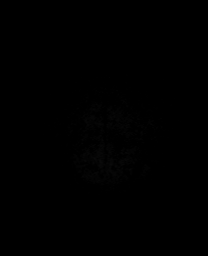

[Series 13: pha_images · axial · 3.0mm · 0.90mm/px · z∈[-84,+59]mm · 4 of 53 slices shown]
[im 1/53]
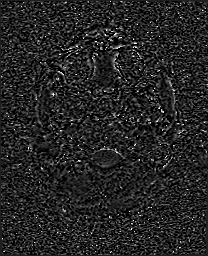
[im 18/53]
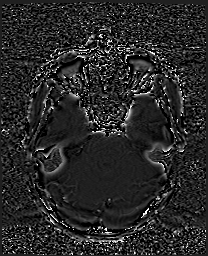
[im 35/53]
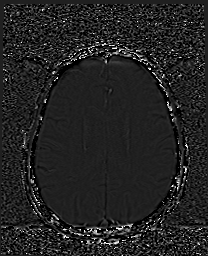
[im 53/53]
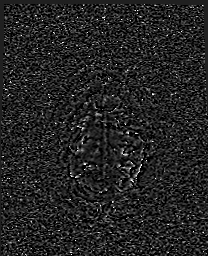

[Series 14: swi_images · axial · 3.0mm · 0.90mm/px · z∈[-84,+67]mm · 4 of 56 slices shown]
[im 1/56]
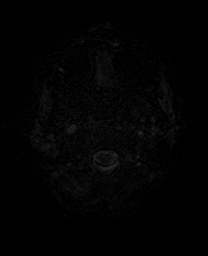
[im 19/56]
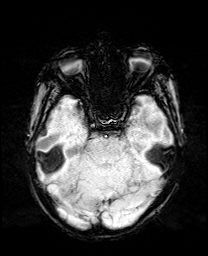
[im 37/56]
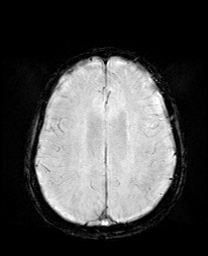
[im 56/56]
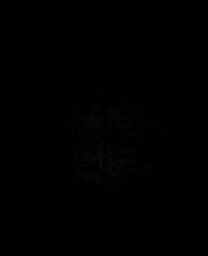

[Series 15: mip_images(sw) · axial · 24.0mm · 0.90mm/px · z∈[-74,+57]mm · 4 of 49 slices shown]
[im 1/49]
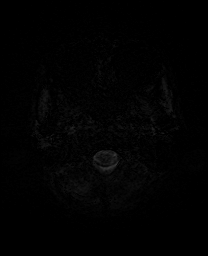
[im 17/49]
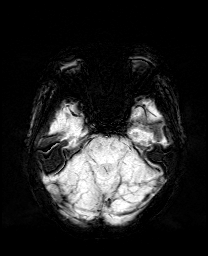
[im 33/49]
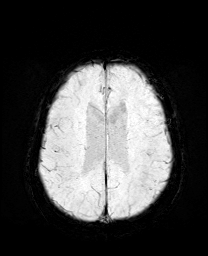
[im 49/49]
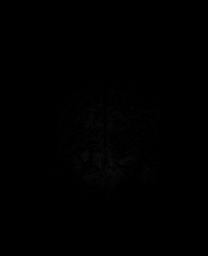

[Series 17: T2 · coronal · 5.0mm · 0.34mm/px · 2 of 32 slices shown (2 of 2)]
[im 1/32]
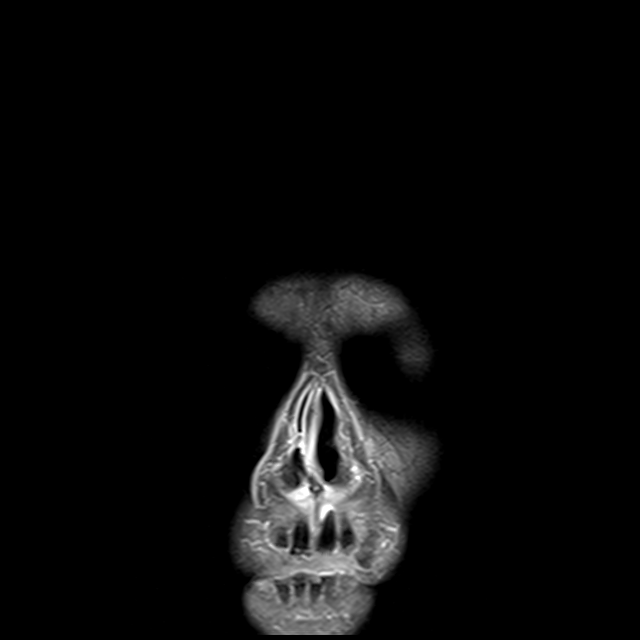
[im 32/32]
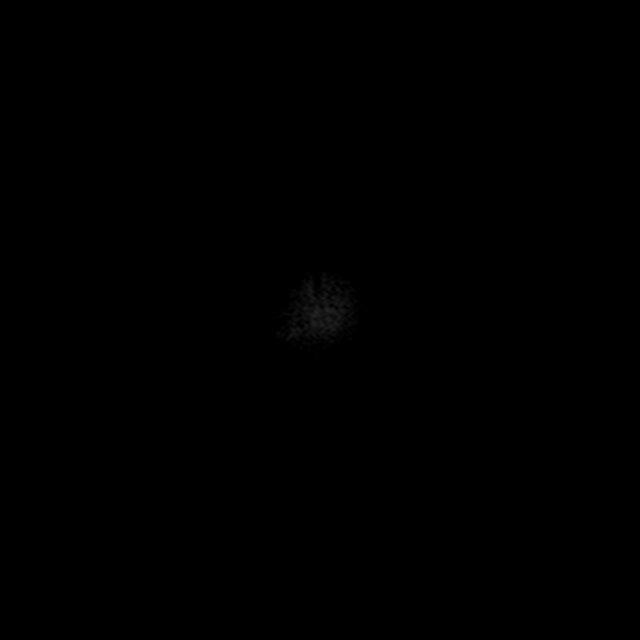

[44 of 48 positions shown; findings below may reference images not displayed]

FINDINGS: Brain: Cerebral volume within normal limits for age. No significant
cerebral white matter disease. No evidence for acute or subacute
infarct. Gray-white matter differentiation maintained. No areas of
chronic cortical infarction. No acute or chronic intracranial blood
products.

No mass lesion or midline shift. No hydrocephalus or extra-axial
fluid collection. Pituitary gland suprasellar region normal. Midline
structures intact and normally formed.

Vascular: Major intracranial vascular flow voids are maintained.

Skull and upper cervical spine: Craniocervical junction within
normal limits. Heterogeneous signal intensity seen within the bone
marrow of the visualized upper cervical spine. No focal marrow
replacing lesion. No scalp soft tissue abnormality.

Sinuses/Orbits: Globes and orbital soft tissues within normal
limits. Paranasal sinuses are largely clear. No significant mastoid
effusion.

Other: None.
IMPRESSION: Normal brain MRI for age. No acute intracranial abnormality
identified.

## 2023-11-12 IMAGING — DX DG CHEST 2V
2 series · 2 of 2 positions shown · non-contrast
Comparison: May 25, 2021

CLINICAL DATA: Hypoxia

EXAM:
CHEST - 2 VIEW

[chest lat]
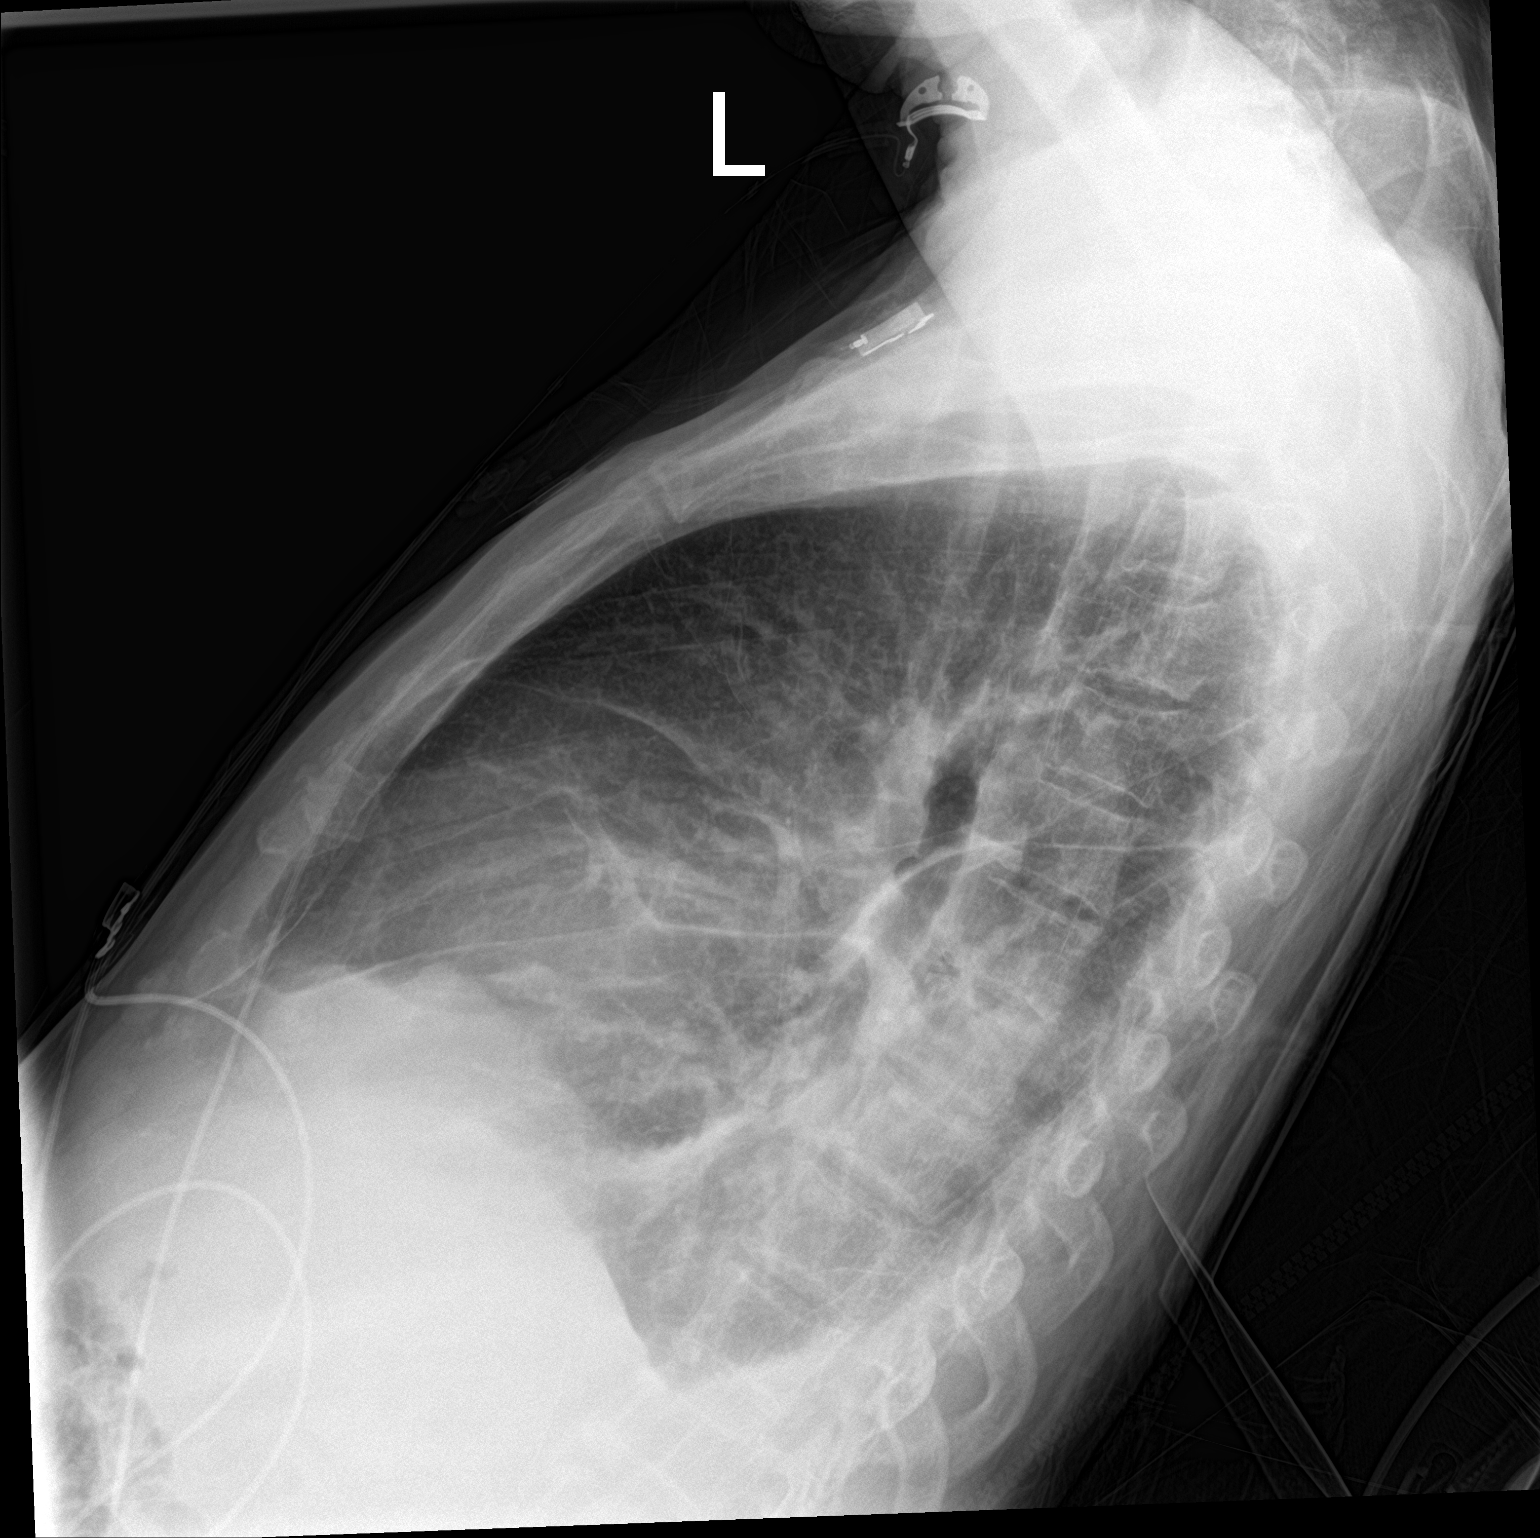

[chest ap]
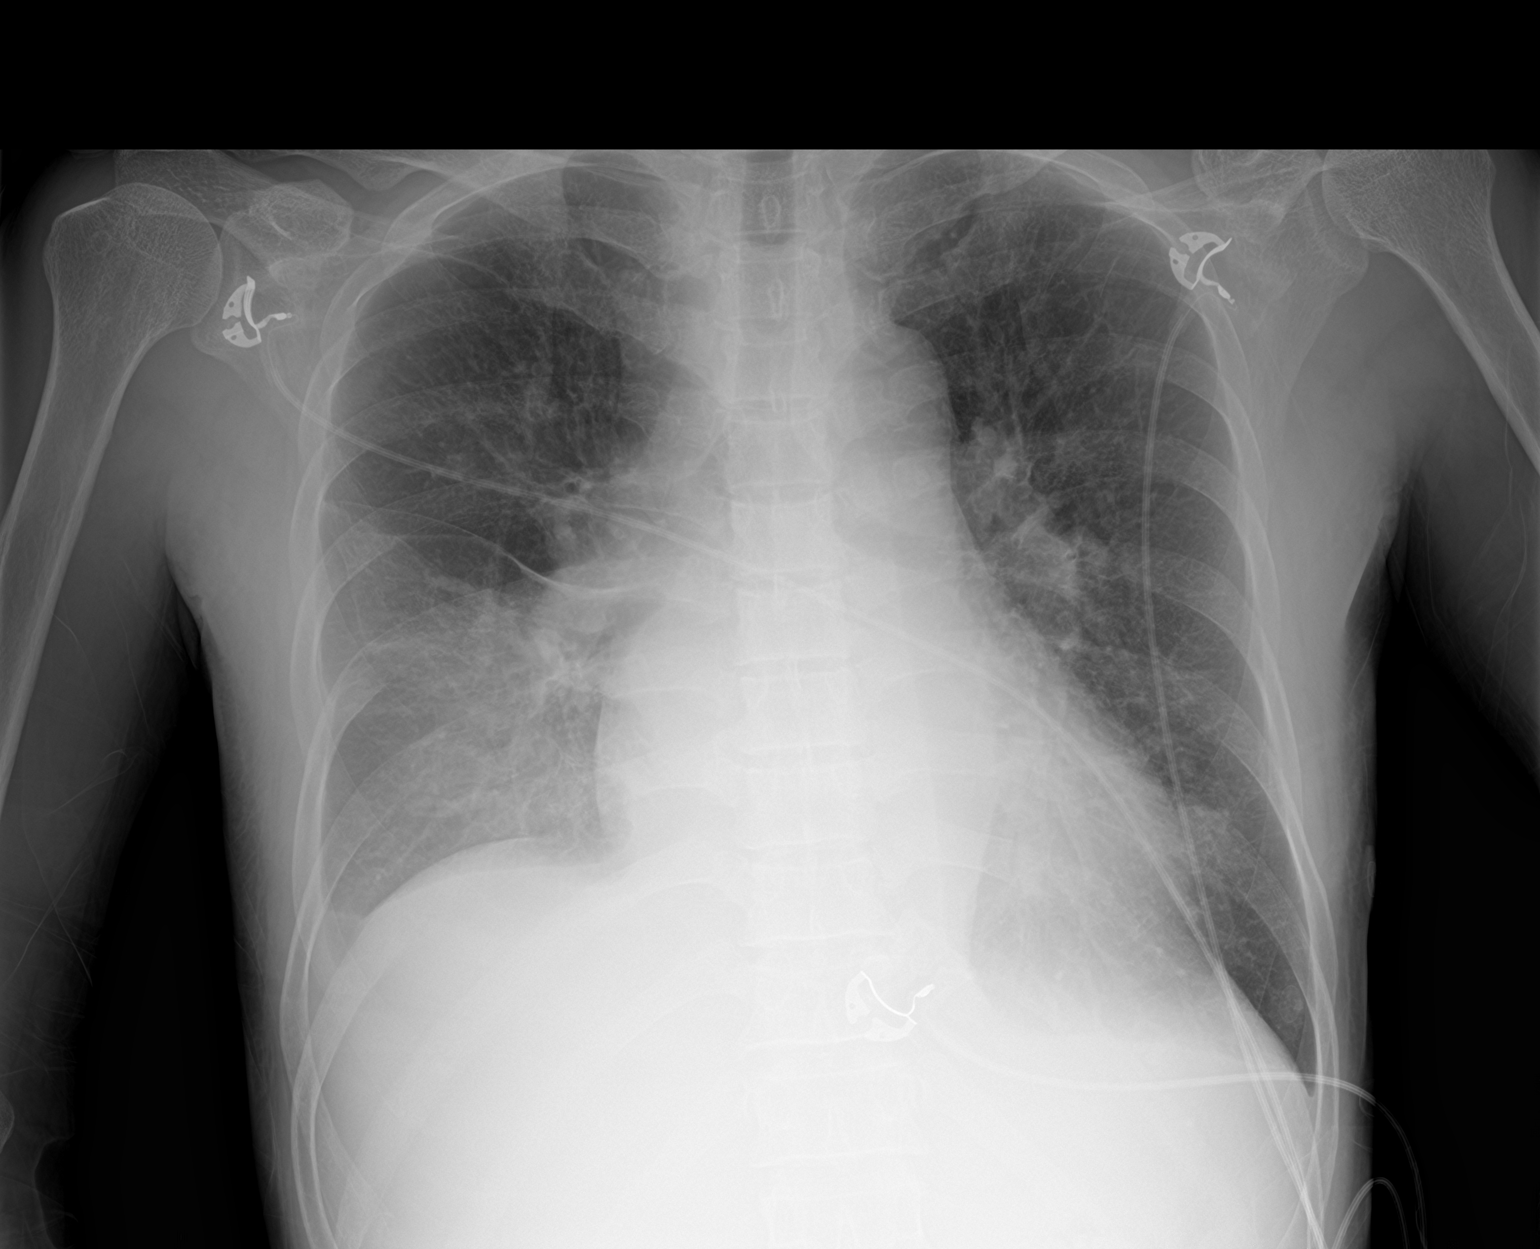

[2 of 2 positions shown; findings below may reference images not displayed]

FINDINGS: Mild cardiomegaly. Thoracic aorta is ectatic. Central pulmonary
vascular congestion. Right pleural effusion. No focal consolidation.
Bony thorax is unremarkable.
IMPRESSION: Mild cardiomegaly, central pulmonary vascular congestion and right
pleural effusion.

## 2023-12-11 ENCOUNTER — Other Ambulatory Visit: Payer: Self-pay | Admitting: Cardiology

## 2023-12-11 DIAGNOSIS — I480 Paroxysmal atrial fibrillation: Secondary | ICD-10-CM

## 2024-01-15 ENCOUNTER — Other Ambulatory Visit: Payer: Self-pay

## 2024-01-15 DIAGNOSIS — R072 Precordial pain: Secondary | ICD-10-CM

## 2024-01-15 MED ORDER — OMEPRAZOLE 20 MG PO CPDR: 20.0000 mg | DELAYED_RELEASE_CAPSULE | Freq: Every day | ORAL | 9 refills | Status: AC | PRN

## 2024-01-25 NOTE — Progress Notes (Deleted)
 {This patient may be at risk for Amyloid. He has one or more dx on the prob list or PMH from the following list -  Abnormal EKG, HFpEF/Diastolic CHF, Aortic Stenosis, LVH, Bilateral Carpal Tunnel Syndrome, Biceps Tendon Rupture, Spinal Stenosis, Pericardial Effusion, Left Atrial Enlargement, Conduction System Disorder. See list below or review PMH.  Diagnoses From Problem List           Noted     (HFpEF) heart failure with preserved ejection fraction (HCC) 12/14/2021     Prolonged QT interval 12/14/2021    Click HERE to open Cardiac Amyloid Screening SmartSet to order screening OR Click HERE to defer testing for 1 year or permanently :1}     OFFICE NOTE:    Date:  01/25/2024  ID:  Gary Ryan, DOB Feb 01, 1956, MRN [id number] PCP: Sophie Dutch, MD  [facility] Providers Cardiologist:  Olinda Bertrand, DO { Click to update primary MD,subspecialty MD or APP then REFRESH:1}      Patient Profile: *** Paroxysmal atrial fibrillation  S/p TEE DCCV in 12/2021  AF likely precipitated by hyperthyroidism - pt opted to remain on anticoagulation  (HFpEF) heart failure with preserved ejection fraction  Admx 12/2021 w acute HF in setting of AF w RVR  Mitral regurgitation Tricuspid regurgitation  TTE 12/15/21: EF 60-65, no RWMA, mildly reduced RVSF, mild RVE, moderate pulm hypertension, RVSP 50, moderate BAE, moderate to severe TR, trivial AI, moderate to severe MR, RAP 15 TEE 12/17/21: EF 55-60, no RWMA, GLS -23.1, NL RVSF, mild LAE, no LAA clot, mild to mod MR, bubble study neg  TTE 08/18/23: EF 65-70, no RWMA, NL RVSF, NL PASP, RVSP 35.9, trivial MR, trivial TR Diabetes mellitus  Hyperthyroidism Carotid US  07/30/2023: No ICA stenosis bilaterally        Discussed the use of AI scribe software for clinical note transcription with the patient, who gave verbal consent to proceed. History of Present Illness Gary Ryan is a 68 y.o. male who returns for AFib, CHF, MR, TR. He was last seen  by Dr. Albert Huff he was taking both atenolol  and metoprolol  as well as digoxin  at that time with marked bradycardia.  He had hypotension.  He was advised to stop taking atenolol .  Digoxin  was stopped.  Entresto  was stopped and he was placed on losartan .    ROS-See HPI***    Studies Reviewed:      *** Results  Risk Assessment/Calculations: {Does this patient have ATRIAL FIBRILLATION?:[id number]} No BP recorded.  {Refresh Note OR Click here to enter BP  :1}***      Physical Exam:  VS:  There were no vitals taken for this visit.       Wt Readings from Last 3 Encounters:  10/20/23 115 lb (52.2 kg)  07/14/23 127 lb 6.4 oz (57.8 kg)  12/09/22 110 lb 3.2 oz (50 kg)    Physical Exam***     Assessment and Plan:    Assessment & Plan Paroxysmal atrial fibrillation (HCC)  Chronic heart failure with preserved ejection fraction (HCC)  Nonrheumatic mitral valve regurgitation  Assessment and Plan Assessment & Plan    {  Paroxysmal atrial fibrillation (HCC) Maintaining sinus rhythm on exam.  He has been off of Eliquis  and atenolol  for about a month.  He had difficulty getting these refilled.  His blood pressure and heart rate can tolerate reinitiation of beta-blocker therapy.  I think he should remain on beta-blocker therapy given the changes in his thyroid  function with treatment  of hyperthyroidism.  He is at risk to have recurrent atrial fibrillation. -Restart Eliquis  5 mg twice daily -DC atenolol  -Start metoprolol  succinate 25 mg daily -Follow-up 3 months Precordial chest pain His chest discomfort sounds noncardiac.  It has been ongoing for 2 years. He has symptoms that can occur at rest or with exertion.  He can exert himself without symptoms.  He has some difficulty swallowing and does have a large goiter.  His symptoms improved with drinking water.  I suspect he may have some acid reflux contributing to his symptoms.  At this point, I do not think he needs an ischemic evaluation.    -Start omeprazole  20 mg daily for 4 weeks.  He can then take this as needed. Chronic heart failure with preserved ejection fraction (HCC) Heart failure occurred in the context of atrial fibrillation with RVR.  When he was hospitalized, he was placed on Entresto  and Farxiga .  Volume status is currently stable.  He is NYHA II.   -Continue Entresto  24/26 mg twice daily.   -Restart Farxiga  10 mg daily. Hyperthyroidism Managed by endocrinology at [facility].  He is still on methimazole  10 mg twice daily.  Recent TSH was over 19.  He has not had a change in his medications since.  I have asked him to follow up with his endocrinologist. Nonrheumatic mitral valve regurgitation TTE in May 2023 with moderate to severe MR and moderate to severe TR.  MR was felt to be mild to moderate on TEE prior to his cardioversion.  He was to have a follow-up echocardiogram after last visit.  However, he was out of town and did not have this done. -Reschedule echocardiogram Bilateral carotid bruits He was to have carotid Dopplers done after last visit.  As noted, he was out of town and this was never done. -Reschedule carotid US     :1}    {Are you ordering a CV Procedure (e.g. stress test, cath, DCCV, TEE, etc)?   Press F2        :[id number]}  Dispo:  No follow-ups on file.  Signed, Marlyse Single, PA-C

## 2024-01-26 ENCOUNTER — Ambulatory Visit: Attending: Physician Assistant | Admitting: Physician Assistant

## 2024-01-26 DIAGNOSIS — I480 Paroxysmal atrial fibrillation: Secondary | ICD-10-CM

## 2024-01-26 DIAGNOSIS — I5032 Chronic diastolic (congestive) heart failure: Secondary | ICD-10-CM

## 2024-01-26 DIAGNOSIS — I340 Nonrheumatic mitral (valve) insufficiency: Secondary | ICD-10-CM
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
## 2024-03-29 ENCOUNTER — Ambulatory Visit: Attending: Physician Assistant | Admitting: Physician Assistant

## 2024-03-29 ENCOUNTER — Encounter: Payer: Self-pay | Admitting: Physician Assistant

## 2024-03-29 VITALS — BP 120/60 | HR 61 | Ht 64.0 in | Wt 124.4 lb

## 2024-03-29 DIAGNOSIS — R072 Precordial pain: Secondary | ICD-10-CM

## 2024-03-29 DIAGNOSIS — R0989 Other specified symptoms and signs involving the circulatory and respiratory systems: Secondary | ICD-10-CM

## 2024-03-29 DIAGNOSIS — Z7901 Long term (current) use of anticoagulants: Secondary | ICD-10-CM

## 2024-03-29 DIAGNOSIS — E0590 Thyrotoxicosis, unspecified without thyrotoxic crisis or storm: Secondary | ICD-10-CM

## 2024-03-29 DIAGNOSIS — E1165 Type 2 diabetes mellitus with hyperglycemia: Secondary | ICD-10-CM

## 2024-03-29 DIAGNOSIS — I480 Paroxysmal atrial fibrillation: Secondary | ICD-10-CM

## 2024-03-29 DIAGNOSIS — I5032 Chronic diastolic (congestive) heart failure: Secondary | ICD-10-CM | POA: Diagnosis not present

## 2024-03-29 MED ORDER — DAPAGLIFLOZIN PROPANEDIOL 10 MG PO TABS: 10.0000 mg | ORAL_TABLET | Freq: Every day | ORAL | 3 refills | Status: AC

## 2024-03-29 NOTE — Patient Instructions (Signed)
 Medication Instructions:  Your physician has recommended you make the following change in your medication:   1) RESTART Farxiga  10 mg daily with breakfast  You can stay off of metoprolol  and losartan  for now. Please continue to monitor your blood pressure and heart rate at home. Call us  if these numbers are greater than 140/80 for blood pressure, or greater than 100 for heart rate.  *If you need a refill on your cardiac medications before your next appointment, please call your pharmacy*  Testing/Procedures: Your physician has requested that you have a carotid duplex. This test is an ultrasound of the carotid arteries in your neck. It looks at blood flow through these arteries that supply the brain with blood. Allow one hour for this exam. There are no restrictions or special instructions.  Follow-Up: At [facility], you and your health needs are our priority.  As part of our continuing mission to provide you with exceptional heart care, our providers are all part of one team.  This team includes your primary Cardiologist (physician) and Advanced Practice Providers or APPs (Physician Assistants and Nurse Practitioners) who all work together to provide you with the care you need, when you need it.  Your next appointment:   6 month(s)  Provider:   Madonna Large, DO  We recommend signing up for the patient portal called MyChart.  Sign up information is provided on this After Visit Summary.  MyChart is used to connect with patients for Virtual Visits (Telemedicine).  Patients are able to view lab/test results, encounter notes, upcoming appointments, etc.  Non-urgent messages can be sent to your provider as well.   To learn more about what you can do with MyChart, go to [email or web address].

## 2024-04-05 ENCOUNTER — Ambulatory Visit (HOSPITAL_COMMUNITY)
Admission: RE | Admit: 2024-04-05 | Discharge: 2024-04-05 | Disposition: A | Discharge status: Home / Self Care | Source: Ambulatory Visit | Attending: Physician Assistant | Admitting: Physician Assistant

## 2024-04-05 DIAGNOSIS — R0989 Other specified symptoms and signs involving the circulatory and respiratory systems: Secondary | ICD-10-CM | POA: Insufficient documentation

## 2024-04-09 ENCOUNTER — Ambulatory Visit: Payer: Self-pay | Admitting: Physician Assistant
